# Patient Record
Sex: Female | Born: 1970 | Race: White | Hispanic: No | Marital: Married | Smoking: Current every day smoker
Health system: Southern US, Community
[De-identification: ages and names within clinical notes are randomized; demographics above are authoritative.]

## PROBLEM LIST (undated history)

## (undated) DIAGNOSIS — F419 Anxiety disorder, unspecified: Secondary | ICD-10-CM

## (undated) DIAGNOSIS — M1990 Unspecified osteoarthritis, unspecified site: Secondary | ICD-10-CM

## (undated) DIAGNOSIS — E785 Hyperlipidemia, unspecified: Secondary | ICD-10-CM

## (undated) DIAGNOSIS — T7840XA Allergy, unspecified, initial encounter: Secondary | ICD-10-CM

## (undated) HISTORY — DX: Allergy, unspecified, initial encounter: T78.40XA

## (undated) HISTORY — DX: Hyperlipidemia, unspecified: E78.5

## (undated) HISTORY — DX: Anxiety disorder, unspecified: F41.9

## (undated) HISTORY — DX: Unspecified osteoarthritis, unspecified site: M19.90

---

## 1998-09-27 ENCOUNTER — Other Ambulatory Visit: Admission: RE | Admit: 1998-09-27 | Discharge: 1998-09-27 | Payer: Self-pay | Admitting: Obstetrics & Gynecology

## 1999-10-15 ENCOUNTER — Other Ambulatory Visit: Admission: RE | Admit: 1999-10-15 | Discharge: 1999-10-15 | Payer: Self-pay | Admitting: Obstetrics & Gynecology

## 2000-10-11 ENCOUNTER — Other Ambulatory Visit: Admission: RE | Admit: 2000-10-11 | Discharge: 2000-10-11 | Payer: Self-pay | Admitting: Obstetrics & Gynecology

## 2001-07-12 ENCOUNTER — Inpatient Hospital Stay (HOSPITAL_COMMUNITY): Admission: AD | Admit: 2001-07-12 | Discharge: 2001-07-15 | Payer: Self-pay | Admitting: Obstetrics and Gynecology

## 2003-11-05 ENCOUNTER — Other Ambulatory Visit: Admission: RE | Admit: 2003-11-05 | Discharge: 2003-11-05 | Payer: Self-pay | Admitting: Obstetrics & Gynecology

## 2004-11-17 ENCOUNTER — Other Ambulatory Visit: Admission: RE | Admit: 2004-11-17 | Discharge: 2004-11-17 | Payer: Self-pay | Admitting: Obstetrics & Gynecology

## 2008-12-03 ENCOUNTER — Encounter: Admission: RE | Admit: 2008-12-03 | Discharge: 2008-12-03 | Payer: Self-pay | Admitting: Specialist

## 2009-08-23 ENCOUNTER — Encounter: Admission: RE | Admit: 2009-08-23 | Discharge: 2009-08-23 | Payer: Self-pay | Admitting: Family Medicine

## 2011-01-30 NOTE — Op Note (Signed)
[facility]  Patient:    Julia Garcia, Julia Garcia Visit Number: [id number] MRN: [id number]          Service Type: OBS Location: 910A 9107 01 Attending Physician:  Lars Pinks Dictated by:   Caralyn Guile Arlyce Dice, M.D. Proc. Date: 07/12/01 Admit Date:  07/12/2001                             Operative Report  PREOPERATIVE DIAGNOSES:       Breech at term.  POSTOPERATIVE DIAGNOSES:      Breech at term.  PROCEDURE:                    Primary low transverse cesarean section.  ANESTHESIA:                   Spinal.  SURGEON:                      Richard D. Arlyce Dice, M.D.  ASSISTANT:                    Luvenia Redden, M.D.  ESTIMATED BLOOD LOSS:         800 cc.  FINDINGS:                     Female infant.  Apgar scores 8 and 9.  Birth weight 7 pounds 0 ounces.  Clear amniotic fluid.  Normal uterus.  INDICATIONS:                  This is a 40 year old prima gravida with an estimated date of confinement of July 19, 2001.  Was found to be breech at 37 weeks.  The options of external version or breech delivery were discussed with the patient and she elected to proceed with primary cesarean section if the baby did not spontaneously evert.  At 39 weeks the patient was noted to still be in the breech presentation and the decision was made to proceed with cesarean section.  PROCEDURE:                    The patient was taken to the operating room and a spinal anesthesia was placed.  The abdomen was prepped and draped in a sterile fashion.  The bladder was catheterized.  A low transverse incision was made and carried down to the fascia which was extended transversely.  The rectus sheath was then divided from the underlying rectus muscle.  The rectus muscle was divided in the midline and the peritoneum was entered sharply and extended by sharp and blunt dissection.  The lower segment was identified. The serosa was incised and the bladder was displaced  inferiorly.  The lower segment was entered sharply and extended transversely with bandage scissors. The infant was delivered in a breech presentation without difficulty.  Cord bloods were obtained.  The placenta was then delivered with traction on the cord.  The uterus was bluntly curettaged so that all membranes were removed. The lower segment was then closed with a running interlocking Vicryl 1 suture. The bladder plane was not reopposed.  The peritoneum and rectus muscle was closed in the midline.  The fascia was closed with a running 0 Vicryl suture. Subcutaneous tissue which was greater than 4 cm was closed with plain cat gut suture and the skin was closed with staples.  The patient tolerated  the procedure well and left the operating room in good condition. Dictated by:   Caralyn Guile Arlyce Dice, M.D. Attending Physician:  Lars Pinks DD:  07/12/01 TD:  07/12/01 Job: [id number]

## 2011-01-30 NOTE — Discharge Summary (Signed)
[facility]  Patient:    Julia Garcia, Julia Garcia Visit Number: [id number] MRN: [id number]          Service Type: OBS Location: 910A 9107 01 Attending Physician:  Lars Pinks Dictated by:   Miguel Aschoff, M.D. Admit Date:  07/12/2001 Discharge Date: 07/15/2001                             Discharge Summary  ADMISSION DIAGNOSES:          Intrauterine pregnancy at term, with a                               breech presentation.  OPERATIONS AND PROCEDURES:    Primary low-flap transverse cesarean section.  ANESTHESIA:                   Spinal.  BRIEF HISTORY:                The patient is a 40 year old white female, gravida 1, para 0, with estimated date of confinement of July 19, 2001. The patient had an essentially uncomplicated pregnancy; however, she did have a malpresentation with a breech presentation noted.  In view of this malpresentation, it was elected to proceed with primary cesarean section.  HOSPITAL COURSE:              This procedure was carried out on July 12, 2001 without difficulty under spinal anesthesia.  It resulted in the delivery of a viable female infant; Apgar 8 at one minute and 9 at five minutes, with the baby weighing 7 pounds 0 ounces.  The patients postoperative course was essentially uncomplicated.  Her hemoglobin remained stable, with hemoglobin being 10.4, hematocrit 30, white count 11,700.  She tolerated increase in ambulation and diet well.  By the July 15, 2001 was in satisfactory condition and felt to be able to stable enough to be discharged home.  HOME MEDICATIONS:             1. Tylox one q.3h. p.r.n. pain.                               2. Continue vitamins and iron.  FOLLOW-UP:                    The patient will be seen back in four weeks for a follow up examination.  INSTRUCTIONS:                 She knows to call if there are any problems such as pain or heavy bleeding, or any problems with her  incision.  She was sent home on a regular diet in satisfactory condition.   HOSPITAL COURSE:              A surgical consult was obtained with Marnee Spring. Arkin, M.D. and a CT scan was obtained to rule out appendicitis.  The CT showed no evidence for acute appendicitis.  The impression of the CT was no ancillary findings suggestive of appendicitis.  There is questionable thickening of the transverse loop of the duodenum, a small fibroid was noted. No focal abnormalities were noted.  The pregnancy appeared to be otherwise normal.  The patient was continued on analgesic therapy and hydration, and by October 29 was felt to be in  satisfactory condition with pain resolving.  Her hemoglobin at this time was 8.7 and white count was 8200.  DISPOSITION:                  The patient was sent home.  She was instructed to return to the office on July 18, 2001 for a follow-up examination.  She was to call for any problems such as fever, pain or heavy bleeding.  She was to continue on her prenatal vitamins and iron.  She was requested to use Tylenol for pain. Dictated by:   Miguel Aschoff, M.D. Attending Physician:  Lars Pinks DD:  07/31/01 TD:  08/01/01 Job: [id number]

## 2011-12-01 ENCOUNTER — Ambulatory Visit (INDEPENDENT_AMBULATORY_CARE_PROVIDER_SITE_OTHER): Payer: BC Managed Care – PPO | Admitting: Family Medicine

## 2011-12-01 VITALS — BP 121/82 | HR 86 | Temp 98.5°F | Resp 16 | Ht 61.75 in | Wt 206.8 lb

## 2011-12-01 DIAGNOSIS — R300 Dysuria: Secondary | ICD-10-CM

## 2011-12-01 DIAGNOSIS — N390 Urinary tract infection, site not specified: Secondary | ICD-10-CM

## 2011-12-01 LAB — POCT URINALYSIS DIPSTICK
Bilirubin, UA: NEGATIVE
Ketones, UA: NEGATIVE
Protein, UA: NEGATIVE
Spec Grav, UA: <=1.005
pH, UA: 7

## 2011-12-01 LAB — POCT UA - MICROSCOPIC ONLY: Crystals, Ur, HPF, POC: NEGATIVE

## 2011-12-01 MED ORDER — NITROFURANTOIN MONOHYD MACRO 100 MG PO CAPS: 100.0000 mg | ORAL_CAPSULE | Freq: Two times a day (BID) | ORAL | Status: AC

## 2011-12-01 NOTE — Progress Notes (Signed)
[facility]:  Office Visit  Chief Complaint:  Chief Complaint  Patient presents with  . Urinary Frequency    burning urination x last night    HPI: Julia Garcia is a 41 y.o. female who complains of  Dysuria x 1 day. No other sxs. Treid Bactrim and Macrobid inpast with good relief.Denies fevers, chills, pelvic or abd pain, N/V.  Did not try anything OTC.   Past Medical History  Diagnosis Date  . Migraine    Past Surgical History  Procedure Date  . Cesarean section    History   Social History  . Marital Status: Married    Spouse Name: N/A    Number of Children: N/A  . Years of Education: N/A   Social History Main Topics  . Smoking status: Current Everyday Smoker -- 0.8 packs/day for 20 years    Types: Cigarettes  . Smokeless tobacco: Not on file  . Alcohol Use: No  . Drug Use: No  . Sexually Active: Not on file   Other Topics Concern  . Not on file   Social History Narrative  . No narrative on file   Family History  Problem Relation Age of Onset  . Cancer Mother    Allergies  Allergen Reactions  . Latex Swelling  . Zithromax (Azithromycin) Swelling    Gets blisters filled with fluid   Prior to Admission medications   Not on File     ROS: The patient denies fevers, chills, night sweats, unintentional weight loss, chest pain, palpitations, wheezing, dyspnea on exertion, nausea, vomiting, abdominal pain,  hematuria, melena, numbness, weakness, or tingling. + dysuria  All other systems have been reviewed and were otherwise negative with the exception of those mentioned in the HPI and as above.    PHYSICAL EXAM: Filed Vitals:   12/01/11 0940  BP: 121/82  Pulse: 86  Temp: 98.5 F (36.9 C)  Resp: 16   Filed Vitals:   12/01/11 0940  Height: 5' 1.75" (1.568 m)  Weight: 206 lb 12.8 oz (93.804 kg)   Body mass index is 38.13 kg/(m^2).  General: Alert, no acute distress HEENT:  Normocephalic, atraumatic, oropharynx patent.    Cardiovascular:  Regular rate and rhythm, no rubs murmurs or gallops.  No Carotid bruits, radial pulse intact. No pedal edema.  Respiratory: Clear to auscultation bilaterally.  No wheezes, rales, or rhonchi.  No cyanosis, no use of accessory musculature GI: No organomegaly, abdomen is soft and non-tender, positive bowel sounds.  No masses. Skin: No rashes. Neurologic: Facial musculature symmetric. Psychiatric: Patient is appropriate throughout our interaction. Lymphatic: No cervical lymphadenopathy Musculoskeletal: Gait intact. NO CVA tenderness   LABS: Results for orders placed in visit on 12/01/11  POCT UA - MICROSCOPIC ONLY      Component Value Range   WBC, Ur, HPF, POC 3-6     RBC, urine, microscopic 2-4     Bacteria, U Microscopic trace     Mucus, UA negative     Epithelial cells, urine per micros 2-4     Crystals, Ur, HPF, POC negative     Casts, Ur, LPF, POC negative     Yeast, UA negative    POCT URINALYSIS DIPSTICK      Component Value Range   Color, UA yellow     Clarity, UA clear     Glucose, UA negative     Bilirubin, UA negative     Ketones, UA negative     Spec Grav, UA <=  1.005     Blood, UA trace     pH, UA 7.0     Protein, UA negative     Urobilinogen, UA 0.2     Nitrite, UA negative     Leukocytes, UA Trace       EKG/XRAY:   Primary read interpreted by Dr. Conley Rolls at [facility].   ASSESSMENT/PLAN: Encounter Diagnosis  Name Primary?  . Dysuria Yes   UTI Will get culture Rx Macrobid    Augustine Leverette PHUONG, DO 12/01/2011 10:23 AM

## 2011-12-03 LAB — URINE CULTURE

## 2012-04-30 ENCOUNTER — Ambulatory Visit (INDEPENDENT_AMBULATORY_CARE_PROVIDER_SITE_OTHER): Payer: BC Managed Care – PPO | Admitting: Internal Medicine

## 2012-04-30 VITALS — BP 123/79 | HR 91 | Temp 98.6°F | Resp 17 | Ht 62.0 in | Wt 224.0 lb

## 2012-04-30 DIAGNOSIS — T300 Burn of unspecified body region, unspecified degree: Secondary | ICD-10-CM

## 2012-04-30 DIAGNOSIS — N390 Urinary tract infection, site not specified: Secondary | ICD-10-CM

## 2012-04-30 LAB — POCT URINALYSIS DIPSTICK
Bilirubin, UA: NEGATIVE
Glucose, UA: NEGATIVE
Nitrite, UA: NEGATIVE
Urobilinogen, UA: 0.2

## 2012-04-30 LAB — POCT UA - MICROSCOPIC ONLY
Casts, Ur, LPF, POC: NEGATIVE
Mucus, UA: NEGATIVE
Yeast, UA: NEGATIVE

## 2012-04-30 MED ORDER — PHENAZOPYRIDINE HCL 200 MG PO TABS: 200.0000 mg | ORAL_TABLET | Freq: Three times a day (TID) | ORAL | Status: AC | PRN

## 2012-04-30 MED ORDER — CIPROFLOXACIN HCL 500 MG PO TABS: 500.0000 mg | ORAL_TABLET | Freq: Two times a day (BID) | ORAL | Status: AC

## 2012-04-30 NOTE — Patient Instructions (Addendum)

## 2012-04-30 NOTE — Progress Notes (Signed)
  Subjective:    Patient ID: Julia Garcia, female    DOB: [birth year], 41 y.o.   MRN: [id number]  HPI Has 3-4d of vague sxs, then sudden onset of burning, urgency, freq. Has had 25-30 utis over past 10 years, never seen a urologist.   Review of Systems     Objective:   Physical Exam  Nursing note and vitals reviewed. Constitutional: She is oriented to person, place, and time. No distress.  Pulmonary/Chest: Effort normal.  Abdominal: Soft. There is tenderness. There is no rebound and no guarding.  Neurological: She is alert and oriented to person, place, and time.  Skin: Skin is warm and dry.  Psychiatric: She has a normal mood and affect.   Tender over bladder, no flank or kidney tenderness. Results for orders placed in visit on 04/30/12  POCT UA - MICROSCOPIC ONLY      Component Value Range   WBC, Ur, HPF, POC 1-3     RBC, urine, microscopic neg     Bacteria, U Microscopic 1     Mucus, UA neg     Epithelial cells, urine per micros neg     Crystals, Ur, HPF, POC neg     Casts, Ur, LPF, POC neg     Yeast, UA neg    POCT URINALYSIS DIPSTICK      Component Value Range   Color, UA yellow     Clarity, UA clear     Glucose, UA neg     Bilirubin, UA neg     Ketones, UA neg     Spec Grav, UA 1.010     Blood, UA mod     pH, UA 7.0     Protein, UA neg     Urobilinogen, UA 0.2     Nitrite, UA neg     Leukocytes, UA Trace     c and s done       Assessment & Plan:  Cipro/pyridium

## 2012-05-03 LAB — URINE CULTURE: Colony Count: >=100000

## 2013-08-29 ENCOUNTER — Ambulatory Visit (INDEPENDENT_AMBULATORY_CARE_PROVIDER_SITE_OTHER): Payer: BC Managed Care – PPO | Admitting: Family Medicine

## 2013-08-29 VITALS — BP 118/68 | HR 97 | Temp 98.4°F | Resp 18 | Ht 61.5 in | Wt 199.0 lb

## 2013-08-29 DIAGNOSIS — R52 Pain, unspecified: Secondary | ICD-10-CM

## 2013-08-29 DIAGNOSIS — R059 Cough, unspecified: Secondary | ICD-10-CM

## 2013-08-29 DIAGNOSIS — R05 Cough: Secondary | ICD-10-CM

## 2013-08-29 DIAGNOSIS — J069 Acute upper respiratory infection, unspecified: Secondary | ICD-10-CM

## 2013-08-29 DIAGNOSIS — J029 Acute pharyngitis, unspecified: Secondary | ICD-10-CM

## 2013-08-29 MED ORDER — HYDROCOD POLST-CHLORPHEN POLST 10-8 MG/5ML PO LQCR: 5.0000 mL | Freq: Every evening | ORAL | Status: DC | PRN

## 2013-08-29 MED ORDER — AMOXICILLIN-POT CLAVULANATE 875-125 MG PO TABS: 1.0000 | ORAL_TABLET | Freq: Two times a day (BID) | ORAL | Status: DC

## 2013-08-29 MED ORDER — IPRATROPIUM BROMIDE 0.03 % NA SOLN: 2.0000 | Freq: Two times a day (BID) | NASAL | Status: DC

## 2013-08-29 NOTE — Patient Instructions (Signed)

## 2013-08-29 NOTE — Progress Notes (Addendum)
Subjective:  This chart was scribed for Nilda Simmer, MD by Carl Best, Medical Scribe. This patient was seen in Room 5 and the patient's care was started at 9:37 AM.   Patient ID: Julia Garcia, female    DOB: 09-Jan-1971, 42 y.o.   MRN: [id number]  HPI HPI Comments: Julia Garcia is a 42 y.o. female who presents to the [facility] complaining of constant, deep cough productive of green and brown sputum, sore throat, sinusitis, generalized body aches, diarrhea, and nausea that started three days ago.  She states that her symptoms have traveled throughout her body and she states that they are now located in her chest.  The patient states that she had an intermittent fever that started on Saturday and ran from 99 - 100 degrees.  She states that she has not experienced a fever today.  She states that she has chills and diaphoresis at baseline.  The patient lists headache, sore throat, and rhinorrhea as associated symptoms.  She states that her mucous is thick and white.  She states that she noticed white patches in the back of her throat and has had trouble swallowing.  She states that she has had 4-5 episodes of diarrhea yesterday and 3 episodes today.  She denies SOB as an associated symptom.  She states that she has used Mucinex D for her symptoms with no relief.    The patient denies having a history of chronic health problems.   The patient states that she is a smoker.  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  She states that she did not receive a flu shot this year.     Past Medical History  Diagnosis Date  . Migraine   . Anxiety    Past Surgical History  Procedure Laterality Date  . Cesarean section     Family History  Problem Relation Age of Onset  . Cancer Mother    History   Social History  . Marital Status: Married    Spouse Name: N/A    Number of Children: N/A  . Years of  Education: N/A   Occupational History  . Not on file.   Social History Main Topics  . Smoking status: Current Every Day Smoker -- 0.80 packs/day for 20 years    Types: Cigarettes  . Smokeless tobacco: Not on file  . Alcohol Use: No  . Drug Use: No  . Sexual Activity: Not on file   Other Topics Concern  . Not on file   Social History Narrative  . No narrative on file   Allergies  Allergen Reactions  . Latex Swelling  . Zithromax [Azithromycin] Swelling    Gets blisters filled with fluid     Review of Systems  Constitutional: Positive for fever (99-100 degrees).  HENT: Positive for postnasal drip, rhinorrhea and trouble swallowing.   Respiratory: Positive for cough. Negative for shortness of breath.   Gastrointestinal: Positive for nausea and diarrhea. Negative for vomiting.  Neurological: Positive for headaches.  All other systems reviewed and are negative.     Objective:  Physical Exam  Nursing note and vitals reviewed. Constitutional: She is oriented to person, place, and time. She appears well-developed and well-nourished. No distress.  HENT:  Head: Normocephalic and atraumatic.  Right Ear: External ear normal.  Left Ear: External ear normal.  Mouth/Throat: Posterior oropharyngeal erythema (diffusely) present.     Eyes:  Conjunctivae and EOM are normal. Pupils are equal, round, and reactive to light.  Neck: Neck supple.  Cardiovascular: Normal rate, regular rhythm and normal heart sounds.  Exam reveals no gallop and no friction rub.   No murmur heard. Pulmonary/Chest: Effort normal and breath sounds normal. No respiratory distress. She has no wheezes. She has no rales.  Lymphadenopathy:    She has cervical adenopathy.  Neurological: She is alert and oriented to person, place, and time. No cranial nerve deficit.  Psychiatric: She has a normal mood and affect. Her behavior is normal.    BP 118/68  Pulse 97  Temp(Src) 98.4 F (36.9 C) (Oral)  Resp 18  Ht  5' 1.5" (1.562 m)  Wt 199 lb (90.266 kg)  BMI 37.00 kg/m2  SpO2 97%  LMP 08/20/2013  Results for orders placed in visit on 08/29/13  POCT INFLUENZA A/B      Result Value Range   Influenza A, POC Negative     Influenza B, POC Negative       Assessment & Plan:  Cough - Plan: POCT Influenza A/B  Body aches - Plan: POCT Influenza A/B  Acute pharyngitis  1. URI:  New. Versus influenza infection. Consistent with viral syndrome. Recommend rest, fluids, Ibuprofen.  Continue Mucinex D bid.  Rx for Atrovent nasal spray, Tussionex provided. If no improvement in one week, start Augmentin for secondary sinusitis.  Meds ordered this encounter  Medications  . pseudoephedrine-guaifenesin (MUCINEX D) 60-600 MG per tablet    Sig: Take 1 tablet by mouth every 12 (twelve) hours.  Marland Kitchen ipratropium (ATROVENT) 0.03 % nasal spray    Sig: Place 2 sprays into the nose 2 (two) times daily.    Dispense:  30 mL    Refill:  5  . chlorpheniramine-HYDROcodone (TUSSIONEX) 10-8 MG/5ML LQCR    Sig: Take 5 mLs by mouth at bedtime as needed for cough.    Dispense:  180 mL    Refill:  0  . amoxicillin-clavulanate (AUGMENTIN) 875-125 MG per tablet    Sig: Take 1 tablet by mouth 2 (two) times daily.    Dispense:  20 tablet    Refill:  0    I personally performed the services described in this documentation, which was scribed in my presence. The recorded information has been reviewed and is accurate.  Nilda Simmer, M.D.  [facility] [address] [place], [place]  [address] [phone] phone [phone] fax

## 2014-11-22 ENCOUNTER — Ambulatory Visit (INDEPENDENT_AMBULATORY_CARE_PROVIDER_SITE_OTHER): Payer: BLUE CROSS/BLUE SHIELD | Admitting: Physician Assistant

## 2014-11-22 VITALS — BP 124/70 | HR 98 | Temp 98.2°F | Resp 21 | Ht 61.0 in | Wt 214.8 lb

## 2014-11-22 DIAGNOSIS — J0100 Acute maxillary sinusitis, unspecified: Secondary | ICD-10-CM

## 2014-11-22 MED ORDER — BENZONATATE 100 MG PO CAPS: 100.0000 mg | ORAL_CAPSULE | Freq: Three times a day (TID) | ORAL | Status: DC | PRN

## 2014-11-22 MED ORDER — IPRATROPIUM BROMIDE 0.03 % NA SOLN: 2.0000 | Freq: Two times a day (BID) | NASAL | Status: DC

## 2014-11-22 MED ORDER — AMOXICILLIN-POT CLAVULANATE 875-125 MG PO TABS: 1.0000 | ORAL_TABLET | Freq: Two times a day (BID) | ORAL | Status: DC

## 2014-11-22 NOTE — Patient Instructions (Signed)

## 2014-11-22 NOTE — Progress Notes (Signed)
Subjective:    Patient ID: Julia Garcia, female    DOB: 1971/01/31, 44 y.o.   MRN: [id number]  HPI Patient presents for 2 weeks of sinus pressure and congestion that got worsened last night. She has been unable to sleep and sx are accompanied with bilateral ear pain, chills, feeling hot, myalgias, productive cough, rhinorrhea, and HA. Has tried Dimeatapp, Zyrtec, and Mucinex without relief. Has multiple sick contacts at work. Has h/o seasonal allergies and smoking, but not asthma. Med allergy to Azithromycin.   Review of Systems  Constitutional: Positive for chills, appetite change and fatigue. Negative for fever and diaphoresis.  HENT: Positive for congestion, ear pain, rhinorrhea, sinus pressure and sore throat. Negative for ear discharge, postnasal drip and sneezing.   Respiratory: Positive for cough. Negative for shortness of breath.   Cardiovascular: Negative for chest pain.  Gastrointestinal: Positive for nausea. Negative for vomiting.  Musculoskeletal: Negative for neck pain and neck stiffness.  Allergic/Immunologic: Positive for environmental allergies.  Neurological: Positive for headaches. Negative for dizziness and light-headedness.       Objective:   Physical Exam  Constitutional: She is oriented to person, place, and time. She appears well-developed and well-nourished. No distress.  Blood pressure 124/70, pulse 98, temperature 98.2 F (36.8 C), resp. rate 21, height 5\' 1"  (1.549 m), weight 214 lb 12.8 oz (97.433 kg), last menstrual period 11/12/2014, SpO2 93 %.  HENT:  Head: Normocephalic and atraumatic.  Right Ear: External ear normal. No swelling or tenderness. Tympanic membrane is not injected, not scarred, not perforated, not erythematous, not retracted and not bulging. No middle ear effusion.  Left Ear: External ear normal. No swelling or tenderness. Tympanic membrane is not injected, not scarred, not perforated, not erythematous, not retracted and not bulging. A  middle ear effusion (serous) is present.  Nose: Rhinorrhea (nares moderately erythematous) present. No mucosal edema. Right sinus exhibits maxillary sinus tenderness. Right sinus exhibits no frontal sinus tenderness. Left sinus exhibits maxillary sinus tenderness. Left sinus exhibits no frontal sinus tenderness.  Mouth/Throat: Uvula is midline. Posterior oropharyngeal erythema present. No oropharyngeal exudate, posterior oropharyngeal edema or tonsillar abscesses.  Eyes: Conjunctivae are normal. Pupils are equal, round, and reactive to light. Right eye exhibits discharge (watery). Left eye exhibits discharge (watery). No scleral icterus.  Neck: Normal range of motion. Neck supple. No thyromegaly present.  Cardiovascular: Normal rate and regular rhythm.  Exam reveals no gallop and no friction rub.   No murmur heard. Pulmonary/Chest: Effort normal and breath sounds normal. No respiratory distress. She has no decreased breath sounds. She has no wheezes. She has no rhonchi. She has no rales. She exhibits no tenderness.  Abdominal: Soft. Bowel sounds are normal. There is no tenderness.  Lymphadenopathy:    She has cervical adenopathy.  Neurological: She is alert and oriented to person, place, and time.  Skin: Skin is warm and dry. No rash noted. She is not diaphoretic. No erythema. No pallor.       Assessment & Plan:  1. Acute maxillary sinusitis, recurrence not specified Can use ibuprofen for pain and fevers. - amoxicillin-clavulanate (AUGMENTIN) 875-125 MG per tablet; Take 1 tablet by mouth 2 (two) times daily.  Dispense: 20 tablet; Refill: 0 - ipratropium (ATROVENT) 0.03 % nasal spray; Place 2 sprays into the nose 2 (two) times daily.  Dispense: 30 mL; Refill: 5 - benzonatate (TESSALON) 100 MG capsule; Take 1-2 capsules (100-200 mg total) by mouth 3 (three) times daily as needed for cough.  Dispense: 40  capsule; Refill: 0   Harlan Ervine PA-C  [facility] 11/22/2014 2:40 PM

## 2015-07-29 ENCOUNTER — Ambulatory Visit (INDEPENDENT_AMBULATORY_CARE_PROVIDER_SITE_OTHER): Payer: BLUE CROSS/BLUE SHIELD | Admitting: Family Medicine

## 2015-07-29 VITALS — BP 110/70 | HR 80 | Temp 98.3°F | Resp 18 | Ht 61.0 in | Wt 206.4 lb

## 2015-07-29 DIAGNOSIS — S161XXA Strain of muscle, fascia and tendon at neck level, initial encounter: Secondary | ICD-10-CM | POA: Diagnosis not present

## 2015-07-29 DIAGNOSIS — M545 Low back pain, unspecified: Secondary | ICD-10-CM

## 2015-07-29 DIAGNOSIS — M542 Cervicalgia: Secondary | ICD-10-CM | POA: Diagnosis not present

## 2015-07-29 MED ORDER — HYDROCODONE-ACETAMINOPHEN 5-325 MG PO TABS: 1.0000 | ORAL_TABLET | Freq: Four times a day (QID) | ORAL | Status: DC | PRN

## 2015-07-29 MED ORDER — DICLOFENAC SODIUM 75 MG PO TBEC: 75.0000 mg | DELAYED_RELEASE_TABLET | Freq: Two times a day (BID) | ORAL | Status: DC

## 2015-07-29 MED ORDER — METHOCARBAMOL 500 MG PO TABS: ORAL_TABLET | ORAL | Status: DC

## 2015-07-29 NOTE — Progress Notes (Signed)
Patient ID: Julia Garcia, female    DOB: [birth year]  Age: 44 y.o. MRN: [id number]  Chief Complaint  Patient presents with  . Back Pain    low back   . Neck Pain    Subjective:   44 year old lady who is here with cervical and low back pain. She felt a pop in her neck in the night associated with pain. She is persistently having severe pain on the left side of the neck. She had been doing some work at home this weekend, but that is nothing new for her. However she has had low back pain also today, which she thinks may be associated with the neck pain. She uses the toilet and went down to her knees because she could not bend down and pull her pants up, so she left work and came over here. She has not had a major cervical trauma. She does have a history of some intermittent low back pains, but this neck stuff his new.  Current allergies, medications, problem list, past/family and social histories reviewed.  Objective:  BP 110/70 mmHg  Pulse 80  Temp(Src) 98.3 F (36.8 C) (Oral)  Resp 18  Ht 5\' 1"  (1.549 m)  Wt 206 lb 6.4 oz (93.622 kg)  BMI 39.02 kg/m2  SpO2 97%  LMP 07/22/2015  Healthy-appearing lady in no major distress until she tries to move and she moves slowly. Her neck is held rigid. She is tender in the left paraspinous muscles of the neck. This cervical spine itself seems normal. Range of motion of the neck has tremendous pain when tilted to the left, or flexed or extended or rotated. Upper motor strength is good and range of motion.. Low back is a little tender in the low back paraspinous muscles. Spine intact. Deep reflexes symmetrical.    Assessment & Plan:   Assessment: 1. Cervical strain, acute, initial encounter   2. Cervical pain   3. Bilateral low back pain without sciatica       Plan: See instructions. She is to return if not improving. She will stay off work the rest of today, and possibly tomorrow if needed.  No orders of the defined types were placed in  this encounter.    Meds ordered this encounter  Medications  . diclofenac (VOLTAREN) 75 MG EC tablet    Sig: Take 1 tablet (75 mg total) by mouth 2 (two) times daily.    Dispense:  30 tablet    Refill:  0  . methocarbamol (ROBAXIN) 500 MG tablet    Sig: Take 1 pill 3 times daily at meals and 2 at bedtime for muscle relaxant as needed    Dispense:  40 tablet    Refill:  0  . HYDROcodone-acetaminophen (NORCO) 5-325 MG tablet    Sig: Take 1 tablet by mouth every 6 (six) hours as needed.    Dispense:  20 tablet    Refill:  0         Patient Instructions  Take Robaxin (methocarbamol) 500 mg 1 tablet 3 times daily at meals and 2 tablets at bedtime as needed for muscle relaxant  Take diclofenac one twice daily for pain and inflammation(this is in the class of ibuprofen and Aleve and you should not take either of them)  You can take additional Tylenol maximum 1000 mg 3 times daily for pain additionally if needed  Norco (hydrocodone/acetaminophen) 5/325 one pill every 4-6 hours as needed for severe pain only. These each containing 325 mg of  Tylenol which is acetaminophen, so that should be entered into the daily total of no more than 3000 mg of acetaminophen daily  Return if not improving  Apply ice or heat or alternate as needed  Gentle limbering up exercises.     Return if symptoms worsen or fail to improve.   Nolin Grell, MD 07/29/2015

## 2015-07-29 NOTE — Patient Instructions (Signed)
Take Robaxin (methocarbamol) 500 mg 1 tablet 3 times daily at meals and 2 tablets at bedtime as needed for muscle relaxant  Take diclofenac one twice daily for pain and inflammation(this is in the class of ibuprofen and Aleve and you should not take either of them)  You can take additional Tylenol maximum 1000 mg 3 times daily for pain additionally if needed  Norco (hydrocodone/acetaminophen) 5/325 one pill every 4-6 hours as needed for severe pain only. These each containing 325 mg of Tylenol which is acetaminophen, so that should be entered into the daily total of no more than 3000 mg of acetaminophen daily  Return if not improving  Apply ice or heat or alternate as needed  Gentle limbering up exercises.

## 2016-02-24 ENCOUNTER — Other Ambulatory Visit: Payer: Self-pay | Admitting: Obstetrics & Gynecology

## 2016-02-24 DIAGNOSIS — R928 Other abnormal and inconclusive findings on diagnostic imaging of breast: Secondary | ICD-10-CM

## 2016-02-27 ENCOUNTER — Ambulatory Visit
Admission: RE | Admit: 2016-02-27 | Discharge: 2016-02-27 | Disposition: A | Discharge status: Home / Self Care | Payer: BLUE CROSS/BLUE SHIELD | Source: Ambulatory Visit | Attending: Obstetrics & Gynecology | Admitting: Obstetrics & Gynecology

## 2016-02-27 DIAGNOSIS — R928 Other abnormal and inconclusive findings on diagnostic imaging of breast: Secondary | ICD-10-CM

## 2018-01-26 DIAGNOSIS — T148XXA Other injury of unspecified body region, initial encounter: Secondary | ICD-10-CM | POA: Insufficient documentation

## 2018-02-04 ENCOUNTER — Encounter: Payer: Self-pay | Admitting: Physician Assistant

## 2018-02-04 ENCOUNTER — Other Ambulatory Visit: Payer: Self-pay

## 2018-02-04 ENCOUNTER — Ambulatory Visit: Payer: 59 | Admitting: Physician Assistant

## 2018-02-04 VITALS — BP 110/74 | HR 89 | Temp 98.8°F | Ht 61.5 in | Wt 214.6 lb

## 2018-02-04 DIAGNOSIS — S29011A Strain of muscle and tendon of front wall of thorax, initial encounter: Secondary | ICD-10-CM

## 2018-02-04 DIAGNOSIS — R079 Chest pain, unspecified: Secondary | ICD-10-CM

## 2018-02-04 MED ORDER — CYCLOBENZAPRINE HCL 10 MG PO TABS: 10.0000 mg | ORAL_TABLET | Freq: Three times a day (TID) | ORAL | 0 refills | Status: DC | PRN

## 2018-02-04 MED ORDER — TRAMADOL HCL 50 MG PO TABS: 50.0000 mg | ORAL_TABLET | Freq: Three times a day (TID) | ORAL | 0 refills | Status: AC | PRN

## 2018-02-04 NOTE — Patient Instructions (Addendum)
Flexeril is a muscle relaxer. Take this as directed. This may make you drowsy.  If flexeril is not helping relieve your apin you may take tramadol. Tramadol is an opioid pain medication. Please take this only as directed. This may make you drowsy. Do not drive or operate heavy machinery while taking this.  SO NOT TAKE THESE MEDICATIONS AT THE SAME TIME.  You may take ibuprofen/tylenol while taking these.   This injury may take 3-4 weeks to heal.  Come back if you are not improving or if your condition worsens.   ?Rest is achieved by limiting weightbearing/lifting for the next 2-3 weeks.Marland Kitchen ?Apply ice for 15 to 20 minutes every two to three hours for the first 48 hours or until swelling is improved, whichever comes first.you may also apply heat to the area - whichever feels better.   Nonsteroidal antiinflammatory drugs (NSAIDs) can be used for pain relief. So naproxen, meloxicam, ibuprofen, motrin or aleve. You can also take Tylenol while taking an NSAID.   Exercise is the mainstay of recovery. Range of motion exercises, including plantar flexion, dorsiflexion, and foot circles, should be started early, once acute pain and swelling subside, to maintain range of motion. The intensity of rehabilitation is increased gradually. Ankle splints or braces can limit extremes of joint motion and allow early weightbearing while protecting against reinjury.  Ankle injuries may take up to 8 weeks to heal.   .     Tramadol tablets What is this medicine? TRAMADOL (TRA ma dole) is a pain reliever. It is used to treat moderate to severe pain in adults. This medicine may be used for other purposes; ask your health care provider or pharmacist if you have questions. COMMON BRAND NAME(S): Ultram What should I tell my health care provider before I take this medicine? They need to know if you have any of these conditions: -brain tumor -depression -drug abuse or addiction -head injury -if you frequently drink  alcohol containing drinks -kidney disease or trouble passing urine -liver disease -lung disease, asthma, or breathing problems -seizures or epilepsy -suicidal thoughts, plans, or attempt; a previous suicide attempt by you or a family member -an unusual or allergic reaction to tramadol, codeine, other medicines, foods, dyes, or preservatives -pregnant or trying to get pregnant -breast-feeding How should I use this medicine? Take this medicine by mouth with a full glass of water. Follow the directions on the prescription label. You can take it with or without food. If it upsets your stomach, take it with food. Do not take your medicine more often than directed. A special MedGuide will be given to you by the pharmacist with each prescription and refill. Be sure to read this information carefully each time. Talk to your pediatrician regarding the use of this medicine in children. Special care may be needed. Overdosage: If you think you have taken too much of this medicine contact a poison control center or emergency room at once. NOTE: This medicine is only for you. Do not share this medicine with others. What if I miss a dose? If you miss a dose, take it as soon as you can. If it is almost time for your next dose, take only that dose. Do not take double or extra doses. What may interact with this medicine? Do not take this medication with any of the following medicines: -MAOIs like Carbex, Eldepryl, Marplan, Nardil, and Parnate This medicine may also interact with the following medications: -alcohol -antihistamines for allergy, cough and cold -certain medicines  for anxiety or sleep -certain medicines for depression like amitriptyline, fluoxetine, sertraline -certain medicines for migraine headache like almotriptan, eletriptan, frovatriptan, naratriptan, rizatriptan, sumatriptan, zolmitriptan -certain medicines for seizures like carbamazepine, oxcarbazepine, phenobarbital, primidone -certain  medicines that treat or prevent blood clots like warfarin -digoxin -furazolidone -general anesthetics like halothane, isoflurane, methoxyflurane, propofol -linezolid -local anesthetics like lidocaine, pramoxine, tetracaine -medicines that relax muscles for surgery -other narcotic medicines for pain or cough -phenothiazines like chlorpromazine, mesoridazine, prochlorperazine, thioridazine -procarbazine This list may not describe all possible interactions. Give your health care provider a list of all the medicines, herbs, non-prescription drugs, or dietary supplements you use. Also tell them if you smoke, drink alcohol, or use illegal drugs. Some items may interact with your medicine. What should I watch for while using this medicine? Tell your doctor or health care professional if your pain does not go away, if it gets worse, or if you have new or a different type of pain. You may develop tolerance to the medicine. Tolerance means that you will need a higher dose of the medicine for pain relief. Tolerance is normal and is expected if you take this medicine for a long time. Do not suddenly stop taking your medicine because you may develop a severe reaction. Your body becomes used to the medicine. This does NOT mean you are addicted. Addiction is a behavior related to getting and using a drug for a non-medical reason. If you have pain, you have a medical reason to take pain medicine. Your doctor will tell you how much medicine to take. If your doctor wants you to stop the medicine, the dose will be slowly lowered over time to avoid any side effects. There are different types of narcotic medicines (opiates). If you take more than one type at the same time or if you are taking another medicine that also causes drowsiness, you may have more side effects. Give your health care provider a list of all medicines you use. Your doctor will tell you how much medicine to take. Do not take more medicine than  directed. Call emergency for help if you have problems breathing or unusual sleepiness. You may get drowsy or dizzy. Do not drive, use machinery, or do anything that needs mental alertness until you know how this medicine affects you. Do not stand or sit up quickly, especially if you are an older patient. This reduces the risk of dizzy or fainting spells. Alcohol can increase or decrease the effects of this medicine. Avoid alcoholic drinks. You may have constipation. Try to have a bowel movement at least every 2 to 3 days. If you do not have a bowel movement for 3 days, call your doctor or health care professional. Your mouth may get dry. Chewing sugarless gum or sucking hard candy, and drinking plenty of water may help. Contact your doctor if the problem does not go away or is severe. What side effects may I notice from receiving this medicine? Side effects that you should report to your doctor or health care professional as soon as possible: -allergic reactions like skin rash, itching or hives, swelling of the face, lips, or tongue -breathing problems -confusion -seizures -signs and symptoms of low blood pressure like dizziness; feeling faint or lightheaded, falls; unusually weak or tired -trouble passing urine or change in the amount of urine Side effects that usually do not require medical attention (report to your doctor or health care professional if they continue or are bothersome): -constipation -dry mouth -nausea, vomiting -tiredness  This list may not describe all possible side effects. Call your doctor for medical advice about side effects. You may report side effects to FDA at 1-800-FDA-1088. Where should I keep my medicine? Keep out of the reach of children. This medicine may cause accidental overdose and death if it taken by other adults, children, or pets. Mix any unused medicine with a substance like cat litter or coffee grounds. Then throw the medicine away in a sealed container  like a sealed bag or a coffee can with a lid. Do not use the medicine after the expiration date. Store at room temperature between 15 and 30 degrees C (59 and 86 degrees F). NOTE: This sheet is a summary. It may not cover all possible information. If you have questions about this medicine, talk to your doctor, pharmacist, or health care provider.  2018 Elsevier/Gold Standard (2015-05-26 09:00:04)  IF you received an x-ray today, you will receive an invoice from [facility]. Please contact [facility] at [phone] with questions or concerns regarding your invoice.   IF you received labwork today, you will receive an invoice from [facility]. Please contact [facility] at [phone] with questions or concerns regarding your invoice.   Our billing staff will not be able to assist you with questions regarding bills from these companies.  You will be contacted with the lab results as soon as they are available. The fastest way to get your results is to activate your My Chart account. Instructions are located on the last page of this paperwork. If you have not heard from [place] regarding the results in 2 weeks, please contact this office.

## 2018-02-04 NOTE — Progress Notes (Signed)
   Julia Garcia  MRN: [id number] DOB: 1970-12-16  PCP: Orma Flaming, MD  Subjective:  Pt is a 47 year old female who presents to clinic for pain under left breast x 8 days.  MOI: lifting TV. Pain is under the left breast and to the left of her sternum.  Pain is worse with coughing or sneezing. Hurts when she presses on the area.  She has taken Tylenol and Ibuprofen - not helping.  She is wearing a sports bra instead of underwire. Denies shob, wheezing, neck pain, numbness, tingling, skin changes.    Review of Systems  Constitutional: Negative for diaphoresis and fatigue.  Respiratory: Negative for cough, chest tightness, shortness of breath and wheezing.   Cardiovascular: Positive for chest pain. Negative for palpitations and leg swelling.    There are no active problems to display for this patient.   Current Outpatient Medications on File Prior to Visit  Medication Sig Dispense Refill  . diclofenac (VOLTAREN) 75 MG EC tablet Take 1 tablet (75 mg total) by mouth 2 (two) times daily. (Patient not taking: Reported on 02/04/2018) 30 tablet 0  . HYDROcodone-acetaminophen (NORCO) 5-325 MG tablet Take 1 tablet by mouth every 6 (six) hours as needed. (Patient not taking: Reported on 02/04/2018) 20 tablet 0  . ipratropium (ATROVENT) 0.03 % nasal spray Place 2 sprays into the nose 2 (two) times daily. (Patient not taking: Reported on 07/29/2015) 30 mL 5  . methocarbamol (ROBAXIN) 500 MG tablet Take 1 pill 3 times daily at meals and 2 at bedtime for muscle relaxant as needed (Patient not taking: Reported on 02/04/2018) 40 tablet 0   No current facility-administered medications on file prior to visit.     Allergies  Allergen Reactions  . Latex Swelling  . Zithromax [Azithromycin] Swelling    Gets blisters filled with fluid     Objective:  BP 110/74 (BP Location: Left Arm, Patient Position: Sitting, Cuff Size: Normal)   Pulse 89   Temp 98.8 F (37.1 C) (Oral)   Ht 5' 1.5" (1.562 m)    Wt 214 lb 9.6 oz (97.3 kg)   LMP 02/03/2018   SpO2 96%   BMI 39.89 kg/m   Physical Exam  Constitutional: She is oriented to person, place, and time. No distress.  Pulmonary/Chest: She exhibits tenderness (left of midline and under left breast). She exhibits no bony tenderness, no crepitus, no edema and no deformity.    Neurological: She is alert and oriented to person, place, and time.  Skin: Skin is warm and dry.  Psychiatric: Judgment normal.  Vitals reviewed.   Assessment and Plan :  1. Muscle strain of chest wall, initial encounter 2. Chest pain, unspecified type - cyclobenzaprine (FLEXERIL) 10 MG tablet; Take 1 tablet (10 mg total) by mouth 3 (three) times daily as needed for muscle spasms.  Dispense: 30 tablet; Refill: 0 - traMADol (ULTRAM) 50 MG tablet; Take 1 tablet (50 mg total) by mouth every 8 (eight) hours as needed for up to 5 days for moderate pain.  Dispense: 15 tablet; Refill: 0 - Pt presents with chest wall pain s/p lifting injury. PE and HPI suggests MSK pain. Plan to treat supportively with flexeril or Tramadol. Medication side effects and dosing discussed with pt. RTC if symptoms worsen/do not improve.    Mercer Pod, PA-C  [facility] 02/04/2018 1:50 PM

## 2018-02-09 ENCOUNTER — Other Ambulatory Visit: Payer: Self-pay

## 2018-02-09 ENCOUNTER — Ambulatory Visit (INDEPENDENT_AMBULATORY_CARE_PROVIDER_SITE_OTHER): Payer: 59 | Admitting: Physician Assistant

## 2018-02-09 ENCOUNTER — Encounter: Payer: Self-pay | Admitting: Physician Assistant

## 2018-02-09 VITALS — BP 114/76 | HR 86 | Temp 98.1°F | Resp 16 | Ht 61.75 in | Wt 215.2 lb

## 2018-02-09 DIAGNOSIS — Z13228 Encounter for screening for other metabolic disorders: Secondary | ICD-10-CM | POA: Diagnosis not present

## 2018-02-09 DIAGNOSIS — Z130 Encounter for screening for diseases of the blood and blood-forming organs and certain disorders involving the immune mechanism: Secondary | ICD-10-CM

## 2018-02-09 DIAGNOSIS — Z1321 Encounter for screening for nutritional disorder: Secondary | ICD-10-CM | POA: Diagnosis not present

## 2018-02-09 DIAGNOSIS — Z13220 Encounter for screening for lipoid disorders: Secondary | ICD-10-CM

## 2018-02-09 DIAGNOSIS — Z0000 Encounter for general adult medical examination without abnormal findings: Secondary | ICD-10-CM

## 2018-02-09 DIAGNOSIS — F17200 Nicotine dependence, unspecified, uncomplicated: Secondary | ICD-10-CM

## 2018-02-09 DIAGNOSIS — Z1329 Encounter for screening for other suspected endocrine disorder: Secondary | ICD-10-CM | POA: Diagnosis not present

## 2018-02-09 DIAGNOSIS — D229 Melanocytic nevi, unspecified: Secondary | ICD-10-CM | POA: Diagnosis not present

## 2018-02-09 LAB — POCT URINALYSIS DIP (MANUAL ENTRY)
Bilirubin, UA: NEGATIVE
Glucose, UA: NEGATIVE mg/dL
Ketones, POC UA: NEGATIVE mg/dL
Leukocytes, UA: NEGATIVE
Nitrite, UA: NEGATIVE
Protein Ur, POC: NEGATIVE mg/dL
Spec Grav, UA: 1.025 (ref 1.010–1.025)
Urobilinogen, UA: 0.2 E.U./dL
pH, UA: 5.5 (ref 5.0–8.0)

## 2018-02-09 NOTE — Progress Notes (Signed)
[facility] at [place], [place] [address] [phone]  Date:  02/09/2018   Name:  Julia Garcia   DOB:  Oct 17, 1970   MRN:  [id number]  PCP:  Dorise Hiss, PA-C    Chief Complaint: Annual Exam (GYN appt sche next month)   History of Present Illness:  This is a pleasant 47 y.o. female with PMH allergies, anxiety, arthritis who is presenting for CPE. Last CPE >15 years ago.  She works Estate agent at [employer].  She is fasting today.   Complaints:  none LMP: May 23. Periods are every 21 days. Becoming less frequent. Has been told by GYN that she is perimenopausal.   Contraception: none  Last pap: she has GYN appt next month for PAP.  Sexual history: married x 19 years. 51 year old son.  Immunizations: needs tdap  Dentist: regular check-ups.  Eye: 20/13 b/l  Diet/Exercise: diet is "horrible".  Walks dogs 3x/day. 8,000-10,000 steps/day.  Fam hx: family history includes Cancer in her father, maternal grandfather, and mother. Tobacco/alcohol/substance use: current every day smoker with a 16-pack year history. Drinks 2-3 drinks/month.   Mammogram: 2017 - negative. sched next MM next month.   Review of Systems:  Review of Systems  Constitutional: Negative for chills, diaphoresis, fatigue and fever.  HENT: Negative for congestion, postnasal drip, rhinorrhea, sinus pressure, sneezing and sore throat.   Respiratory: Negative for cough, chest tightness, shortness of breath and wheezing.   Cardiovascular: Negative for chest pain and palpitations.  Gastrointestinal: Negative for abdominal pain, diarrhea, nausea and vomiting.  Genitourinary: Negative for decreased urine volume, difficulty urinating, dysuria, enuresis, flank pain, frequency, hematuria and urgency.  Musculoskeletal: Negative for back pain.  Neurological: Negative for dizziness, weakness, light-headedness and headaches.    There are no active problems to display for this patient.   Prior  to Admission medications   Medication Sig Start Date End Date Taking? Authorizing Provider  cyclobenzaprine (FLEXERIL) 10 MG tablet Take 1 tablet (10 mg total) by mouth 3 (three) times daily as needed for muscle spasms. 02/04/18  Yes Shalane Florendo, Gelene Mink, PA-C  traMADol (ULTRAM) 50 MG tablet Take 1 tablet (50 mg total) by mouth every 8 (eight) hours as needed for up to 5 days for moderate pain. 02/04/18 02/09/18 Yes Salim Forero, Gelene Mink, PA-C    Allergies  Allergen Reactions  . Latex Swelling  . Zithromax [Azithromycin] Swelling    Gets blisters filled with fluid    Past Surgical History:  Procedure Laterality Date  . CESAREAN SECTION      Social History   Tobacco Use  . Smoking status: Current Every Day Smoker    Packs/day: 0.80    Years: 20.00    Pack years: 16.00    Types: Cigarettes  . Smokeless tobacco: Never Used  Substance Use Topics  . Alcohol use: No  . Drug use: No    Family History  Problem Relation Age of Onset  . Cancer Mother        Lung and throat  . Cancer Father        Prostate  . Cancer Maternal Grandfather     Medication list has been reviewed and updated.  Physical Examination:  Physical Exam  Constitutional: She is oriented to person, place, and time. She appears well-developed and well-nourished. No distress.  HENT:  Head: Normocephalic and atraumatic.  Mouth/Throat: Oropharynx is clear and moist.  Eyes: Pupils are equal, round, and reactive to light. Conjunctivae and EOM are normal.  Neck: Normal range of motion. Neck supple. No thyromegaly present.  Cardiovascular: Normal rate, regular rhythm and normal heart sounds.  No murmur heard. Pulmonary/Chest: Effort normal and breath sounds normal. She has no wheezes.  Abdominal: Soft. Bowel sounds are normal. She exhibits no mass.  Musculoskeletal: Normal range of motion.  Neurological: She is alert and oriented to person, place, and time. She has normal reflexes.  Skin: Skin is warm and  dry.  Multiple nevus on torso. No suspicious lesions appreciated.   Psychiatric: She has a normal mood and affect. Her behavior is normal. Judgment and thought content normal.  Vitals reviewed.   BP 114/76 (BP Location: Left Arm, Patient Position: Sitting, Cuff Size: Normal)   Pulse 86   Temp 98.1 F (36.7 C) (Oral)   Resp 16   Ht 5' 1.75" (1.568 m)   Wt 215 lb 3.2 oz (97.6 kg)   LMP 02/03/2018   SpO2 96%   BMI 39.68 kg/m   Results for orders placed or performed in visit on 02/09/18  POCT urinalysis dipstick  Result Value Ref Range   Color, UA yellow yellow   Clarity, UA clear clear   Glucose, UA negative negative mg/dL   Bilirubin, UA negative negative   Ketones, POC UA negative negative mg/dL   Spec Grav, UA 1.025 1.010 - 1.025   Blood, UA moderate (A) negative   pH, UA 5.5 5.0 - 8.0   Protein Ur, POC negative negative mg/dL   Urobilinogen, UA 0.2 0.2 or 1.0 E.U./dL   Nitrite, UA Negative Negative   Leukocytes, UA Negative Negative    Assessment and Plan: 1. Annual physical exam -Patient presents for her first annual exam in over 15 years.  She has a mammogram and Pap scheduled for next month.  Discussed smoking cessation, healthy weight and improved lifestyle.  Routine labs are pending, will contact with results. Health maintenance discussed and printed out for pt. Plan to send to derm for routine skin check.  2. Screening for endocrine, nutritional, metabolic and immunity disorder - CBC with Differential/Platelet - TSH - CMP14+EGFR - POCT urinalysis dipstick - VITAMIN D 25 Hydroxy (Vit-D Deficiency, Fractures)  3. Screening, lipid - Lipid panel  4. Nevus - Ambulatory referral to Dermatology 5. Current every day smoker - Smoking cessation discussed. She is not ready to quit.   Mercer Pod, PA-C  [facility] 02/09/2018 8:51 AM

## 2018-02-09 NOTE — Patient Instructions (Addendum)
Try cutting back to 1/2 pack cigarettes/day!   You will receive a phone call to schedule an appointment with dermatology.    We will contact you with the results of your labs.    ACTIVITY/FITNESS Keep moving your body to the best of your abilities. You can do this at home, inside or outside, the park, community center, gym or anywhere you like. Consider the app Sworkit. Fitness trackers such as smart-watches, smart-phones or Fitbits can help as well.  RELAXATION Consider practicing mindfulness meditation or other relaxation techniques such as deep breathing, prayer, yoga, tai chi, massage. See website mindful.org or the apps Headspace or Calm to help get started.  NUTRITION Eat more plants: colorful vegetables, nuts, seeds and berries.  Eat less sugar, salt, preservatives and processed foods.  Avoid toxins such as cigarettes and alcohol.  Drink water when you are thirsty. Warm water with a slice of lemon is an excellent morning drink to start the day.  Consider these websites for more information The Nutrition Source (https://www.henry-hernandez.biz/) Precision Nutrition (WindowBlog.ch)  Try to shop mostly along the perimeter of the grocery store. Cut down consumption of processed foods.   The following foods are the foundation of a heart-healthy diet: Vegetables such as greens (spinach, collard greens, kale), broccoli, cabbage, carrots, bell peppers; stay away from starchy vegetables like potatoes, carrots, peas Fruits such as avocados, apples, berries, bananas, oranges, pears, grapes, and prunes  Whole grains such as plain oatmeal, brown rice, and whole-grain bread or tortillas  Fat-free or low-fat dairy foods such as milk, cheese, or yogurt  Protein-rich foods:  Fish high in omega-3 fatty acids, such as salmon, tuna, and trout, about 8 ounces a week  Lean meats such as 95 percent lean ground beef or pork tenderloin  Poultry  such as skinless chicken or [place]  Eggs  Nuts, seeds, and soy products: quinoa, chia seeds Legumes such as kidney beans, lentils, chickpeas, black-eyed peas, and lima beans Oils and foods containing high levels of monounsaturated and polyunsaturated fats that can help lower blood cholesterol levels and the risk of cardiovascular disease. Some sources of these oils are:  Canola, corn, olive, safflower, sesame, sunflower, and soybean oils  Nuts such as walnuts, almonds, and pine nuts  Nut and seed butters  Salmon and trout  Seeds such as sesame, sunflower, pumpkin, or flax  Avocados  Tofu  Recipe ideas: [employer], [employer], Lung, and [place], allrecipes.com, wholefoodsmarket.com  Limit added sugars When you follow a heart-healthy eating plan, you should limit the amount of calories you consume each day from added sugars. Because added sugars do not provide essential nutrients and are extra calories, limiting them can help you choose nutrient-rich foods and stay within your daily calorie limit. Some foods, such as fruit, contain natural sugars. Added sugars do not occur naturally in foods, but instead are used to sweeten foods and drinks. Some examples of added sugars include brown sugar, corn syrup, dextrose, fructose, glucose, high-fructose corn syrup, raw sugar, and sucrose. In the [place], sweetened drinks, snacks, and sweets are the major sources of added sugars. Sweetened drinks account for about half of all added sugars consumed. The following are examples of foods and drinks with added sugars. Sweetened drinks include soft drinks or sodas, fruit drinks, sweetened coffee and tea, energy drinks, alcoholic drinks, and favored waters.  Snacks and sweets include grain-based desserts such as cakes, pies, cookies, brownies, doughnuts; dairy desserts such as ice cream, frozen desserts, and pudding; candies;  sugars; jams; syrups; and sweet toppings. To help you reduce  the amount of added sugars in your diet: Choose unsweetened or whole fruits for snacks or dessert.  Choose drinks without added sugar such as water, low-fat or fat-free milk, or 100 percent fruit or vegetable juice.  Limit intake of sweetened drinks, snacks and desserts by eating them less often and in smaller amounts.   Health Maintenance, Female Adopting a healthy lifestyle and getting preventive care can go a long way to promote health and wellness. Talk with your health care provider about what schedule of regular examinations is right for you. This is a good chance for you to check in with your provider about disease prevention and staying healthy. In between checkups, there are plenty of things you can do on your own. Experts have done a lot of research about which lifestyle changes and preventive measures are most likely to keep you healthy. Ask your health care provider for more information. Weight and diet Eat a healthy diet  Be sure to include plenty of vegetables, fruits, low-fat dairy products, and lean protein.  Do not eat a lot of foods high in solid fats, added sugars, or salt.  Get regular exercise. This is one of the most important things you can do for your health. ? Most adults should exercise for at least 150 minutes each week. The exercise should increase your heart rate and make you sweat (moderate-intensity exercise). ? Most adults should also do strengthening exercises at least twice a week. This is in addition to the moderate-intensity exercise.  Maintain a healthy weight  Body mass index (BMI) is a measurement that can be used to identify possible weight problems. It estimates body fat based on height and weight. Your health care provider can help determine your BMI and help you achieve or maintain a healthy weight.  For females 18 years of age and older: ? A BMI below 18.5 is considered underweight. ? A BMI of 18.5 to 24.9 is normal. ? A BMI of 25 to 29.9 is  considered overweight. ? A BMI of 30 and above is considered obese.  Watch levels of cholesterol and blood lipids  You should start having your blood tested for lipids and cholesterol at 47 years of age, then have this test every 5 years.  You may need to have your cholesterol levels checked more often if: ? Your lipid or cholesterol levels are high. ? You are older than 47 years of age. ? You are at high risk for heart disease.  Cancer screening Lung Cancer  Lung cancer screening is recommended for adults 52-36 years old who are at high risk for lung cancer because of a history of smoking.  A yearly low-dose CT scan of the lungs is recommended for people who: ? Currently smoke. ? Have quit within the past 15 years. ? Have at least a 30-pack-year history of smoking. A pack year is smoking an average of one pack of cigarettes a day for 1 year.  Yearly screening should continue until it has been 15 years since you quit.  Yearly screening should stop if you develop a health problem that would prevent you from having lung cancer treatment.  Breast Cancer  Practice breast self-awareness. This means understanding how your breasts normally appear and feel.  It also means doing regular breast self-exams. Let your health care provider know about any changes, no matter how small.  If you are in your 20s or 30s, you should  have a clinical breast exam (CBE) by a health care provider every 1-3 years as part of a regular health exam.  If you are 80 or older, have a CBE every year. Also consider having a breast X-ray (mammogram) every year.  If you have a family history of breast cancer, talk to your health care provider about genetic screening.  If you are at high risk for breast cancer, talk to your health care provider about having an MRI and a mammogram every year.  Breast cancer gene (BRCA) assessment is recommended for women who have family members with BRCA-related cancers.  BRCA-related cancers include: ? Breast. ? Ovarian. ? Tubal. ? Peritoneal cancers.  Results of the assessment will determine the need for genetic counseling and BRCA1 and BRCA2 testing.  Cervical Cancer Your health care provider may recommend that you be screened regularly for cancer of the pelvic organs (ovaries, uterus, and vagina). This screening involves a pelvic examination, including checking for microscopic changes to the surface of your cervix (Pap test). You may be encouraged to have this screening done every 3 years, beginning at age 59.  For women ages 47-65, health care providers may recommend pelvic exams and Pap testing every 3 years, or they may recommend the Pap and pelvic exam, combined with testing for human papilloma virus (HPV), every 5 years. Some types of HPV increase your risk of cervical cancer. Testing for HPV may also be done on women of any age with unclear Pap test results.  Other health care providers may not recommend any screening for nonpregnant women who are considered low risk for pelvic cancer and who do not have symptoms. Ask your health care provider if a screening pelvic exam is right for you.  If you have had past treatment for cervical cancer or a condition that could lead to cancer, you need Pap tests and screening for cancer for at least 20 years after your treatment. If Pap tests have been discontinued, your risk factors (such as having a new sexual partner) need to be reassessed to determine if screening should resume. Some women have medical problems that increase the chance of getting cervical cancer. In these cases, your health care provider may recommend more frequent screening and Pap tests.  Colorectal Cancer  This type of cancer can be detected and often prevented.  Routine colorectal cancer screening usually begins at 47 years of age and continues through 47 years of age.  Your health care provider may recommend screening at an earlier age if  you have risk factors for colon cancer.  Your health care provider may also recommend using home test kits to check for hidden blood in the stool.  A small camera at the end of a tube can be used to examine your colon directly (sigmoidoscopy or colonoscopy). This is done to check for the earliest forms of colorectal cancer.  Routine screening usually begins at age 99.  Direct examination of the colon should be repeated every 5-10 years through 47 years of age. However, you may need to be screened more often if early forms of precancerous polyps or small growths are found.  Skin Cancer  Check your skin from head to toe regularly.  Tell your health care provider about any new moles or changes in moles, especially if there is a change in a mole's shape or color.  Also tell your health care provider if you have a mole that is larger than the size of a pencil eraser.  Always  use sunscreen. Apply sunscreen liberally and repeatedly throughout the day.  Protect yourself by wearing long sleeves, pants, a wide-brimmed hat, and sunglasses whenever you are outside.  Heart disease, diabetes, and high blood pressure  High blood pressure causes heart disease and increases the risk of stroke. High blood pressure is more likely to develop in: ? People who have blood pressure in the high end of the normal range (130-139/85-89 mm Hg). ? People who are overweight or obese. ? People who are African American.  If you are 18-27 years of age, have your blood pressure checked every 3-5 years. If you are 32 years of age or older, have your blood pressure checked every year. You should have your blood pressure measured twice-once when you are at a hospital or clinic, and once when you are not at a hospital or clinic. Record the average of the two measurements. To check your blood pressure when you are not at a hospital or clinic, you can use: ? An automated blood pressure machine at a pharmacy. ? A home blood  pressure monitor.  If you are between 72 years and 79 years old, ask your health care provider if you should take aspirin to prevent strokes.  Have regular diabetes screenings. This involves taking a blood sample to check your fasting blood sugar level. ? If you are at a normal weight and have a low risk for diabetes, have this test once every three years after 47 years of age. ? If you are overweight and have a high risk for diabetes, consider being tested at a younger age or more often. Preventing infection Hepatitis B  If you have a higher risk for hepatitis B, you should be screened for this virus. You are considered at high risk for hepatitis B if: ? You were born in a country where hepatitis B is common. Ask your health care provider which countries are considered high risk. ? Your parents were born in a high-risk country, and you have not been immunized against hepatitis B (hepatitis B vaccine). ? You have HIV or AIDS. ? You use needles to inject street drugs. ? You live with someone who has hepatitis B. ? You have had sex with someone who has hepatitis B. ? You get hemodialysis treatment. ? You take certain medicines for conditions, including cancer, organ transplantation, and autoimmune conditions.  Hepatitis C  Blood testing is recommended for: ? Everyone born from [birth year] through 1965. ? Anyone with known risk factors for hepatitis C.  Sexually transmitted infections (STIs)  You should be screened for sexually transmitted infections (STIs) including gonorrhea and chlamydia if: ? You are sexually active and are younger than 47 years of age. ? You are older than 47 years of age and your health care provider tells you that you are at risk for this type of infection. ? Your sexual activity has changed since you were last screened and you are at an increased risk for chlamydia or gonorrhea. Ask your health care provider if you are at risk.  If you do not have HIV, but are at risk,  it may be recommended that you take a prescription medicine daily to prevent HIV infection. This is called pre-exposure prophylaxis (PrEP). You are considered at risk if: ? You are sexually active and do not regularly use condoms or know the HIV status of your partner(s). ? You take drugs by injection. ? You are sexually active with a partner who has HIV.  Talk with your  health care provider about whether you are at high risk of being infected with HIV. If you choose to begin PrEP, you should first be tested for HIV. You should then be tested every 3 months for as long as you are taking PrEP. Pregnancy  If you are premenopausal and you may become pregnant, ask your health care provider about preconception counseling.  If you may become pregnant, take 400 to 800 micrograms (mcg) of folic acid every day.  If you want to prevent pregnancy, talk to your health care provider about birth control (contraception). Osteoporosis and menopause  Osteoporosis is a disease in which the bones lose minerals and strength with aging. This can result in serious bone fractures. Your risk for osteoporosis can be identified using a bone density scan.  If you are 79 years of age or older, or if you are at risk for osteoporosis and fractures, ask your health care provider if you should be screened.  Ask your health care provider whether you should take a calcium or vitamin D supplement to lower your risk for osteoporosis.  Menopause may have certain physical symptoms and risks.  Hormone replacement therapy may reduce some of these symptoms and risks. Talk to your health care provider about whether hormone replacement therapy is right for you. Follow these instructions at home:  Schedule regular health, dental, and eye exams.  Stay current with your immunizations.  Do not use any tobacco products including cigarettes, chewing tobacco, or electronic cigarettes.  If you are pregnant, do not drink  alcohol.  If you are breastfeeding, limit how much and how often you drink alcohol.  Limit alcohol intake to no more than 1 drink per day for nonpregnant women. One drink equals 12 ounces of beer, 5 ounces of wine, or 1 ounces of hard liquor.  Do not use street drugs.  Do not share needles.  Ask your health care provider for help if you need support or information about quitting drugs.  Tell your health care provider if you often feel depressed.  Tell your health care provider if you have ever been abused or do not feel safe at home. This information is not intended to replace advice given to you by your health care provider. Make sure you discuss any questions you have with your health care provider. Document Released: 03/16/2011 Document Revised: 02/06/2016 Document Reviewed: 06/04/2015 Elsevier Interactive Patient Education  2018 [employer].  IF you received an x-ray today, you will receive an invoice from [facility]. Please contact [facility] at [phone] with questions or concerns regarding your invoice.   IF you received labwork today, you will receive an invoice from [facility] [facility]. Please contact [facility] at [phone] with questions or concerns regarding your invoice.   Our billing staff will not be able to assist you with questions regarding bills from these companies.  You will be contacted with the lab results as soon as they are available. The fastest way to get your results is to activate your My Chart account. Instructions are located on the last page of this paperwork. If you have not heard from [place] regarding the results in 2 weeks, please contact this office.

## 2018-02-10 LAB — CMP14+EGFR
ALT: 11 IU/L (ref 0–32)
AST: 13 IU/L (ref 0–40)
Albumin/Globulin Ratio: 1.5 (ref 1.2–2.2)
Albumin: 4 g/dL (ref 3.5–5.5)
Alkaline Phosphatase: 75 IU/L (ref 39–117)
BUN/Creatinine Ratio: 13 (ref 9–23)
BUN: 8 mg/dL (ref 6–24)
Bilirubin Total: 0.2 mg/dL (ref 0.0–1.2)
CO2: 19 mmol/L — ABNORMAL LOW (ref 20–29)
Calcium: 8.4 mg/dL — ABNORMAL LOW (ref 8.7–10.2)
Chloride: 105 mmol/L (ref 96–106)
Creatinine, Ser: 0.6 mg/dL (ref 0.57–1.00)
GFR calc Af Amer: 126 mL/min/{1.73_m2} (ref >59)
GFR calc non Af Amer: 109 mL/min/{1.73_m2} (ref >59)
Globulin, Total: 2.6 g/dL (ref 1.5–4.5)
Glucose: 83 mg/dL (ref 65–99)
Potassium: 4.2 mmol/L (ref 3.5–5.2)
Sodium: 138 mmol/L (ref 134–144)
Total Protein: 6.6 g/dL (ref 6.0–8.5)

## 2018-02-10 LAB — CBC WITH DIFFERENTIAL/PLATELET
Basophils Absolute: 0 10*3/uL (ref 0.0–0.2)
Basos: 0 %
EOS (ABSOLUTE): 0.1 10*3/uL (ref 0.0–0.4)
Eos: 1 %
Hematocrit: 39.2 % (ref 34.0–46.6)
Hemoglobin: 12.5 g/dL (ref 11.1–15.9)
Immature Grans (Abs): 0 10*3/uL (ref 0.0–0.1)
Immature Granulocytes: 0 %
Lymphocytes Absolute: 1.8 10*3/uL (ref 0.7–3.1)
Lymphs: 22 %
MCH: 27.9 pg (ref 26.6–33.0)
MCHC: 31.9 g/dL (ref 31.5–35.7)
MCV: 88 fL (ref 79–97)
Monocytes Absolute: 0.5 10*3/uL (ref 0.1–0.9)
Monocytes: 6 %
Neutrophils Absolute: 5.6 10*3/uL (ref 1.4–7.0)
Neutrophils: 71 %
Platelets: 257 10*3/uL (ref 150–450)
RBC: 4.48 x10E6/uL (ref 3.77–5.28)
RDW: 14.9 % (ref 12.3–15.4)
WBC: 8.1 10*3/uL (ref 3.4–10.8)

## 2018-02-10 LAB — LIPID PANEL
Chol/HDL Ratio: 4.4 ratio (ref 0.0–4.4)
Cholesterol, Total: 184 mg/dL (ref 100–199)
HDL: 42 mg/dL (ref >39)
LDL Calculated: 114 mg/dL — ABNORMAL HIGH (ref 0–99)
Triglycerides: 139 mg/dL (ref 0–149)
VLDL Cholesterol Cal: 28 mg/dL (ref 5–40)

## 2018-02-10 LAB — TSH: TSH: 2.01 u[IU]/mL (ref 0.450–4.500)

## 2018-02-10 LAB — VITAMIN D 25 HYDROXY (VIT D DEFICIENCY, FRACTURES): Vit D, 25-Hydroxy: 13.3 ng/mL — ABNORMAL LOW (ref 30.0–100.0)

## 2018-02-24 DIAGNOSIS — Z01419 Encounter for gynecological examination (general) (routine) without abnormal findings: Secondary | ICD-10-CM | POA: Diagnosis not present

## 2018-02-24 DIAGNOSIS — Z1231 Encounter for screening mammogram for malignant neoplasm of breast: Secondary | ICD-10-CM | POA: Diagnosis not present

## 2018-03-07 NOTE — Progress Notes (Signed)
Results released to mychart. Start vit d supplement. RTC in 3 months for recheck.

## 2018-07-05 ENCOUNTER — Ambulatory Visit: Payer: 59 | Admitting: Physician Assistant

## 2018-07-05 ENCOUNTER — Ambulatory Visit (INDEPENDENT_AMBULATORY_CARE_PROVIDER_SITE_OTHER): Payer: 59

## 2018-07-05 ENCOUNTER — Encounter: Payer: Self-pay | Admitting: Physician Assistant

## 2018-07-05 ENCOUNTER — Other Ambulatory Visit: Payer: Self-pay

## 2018-07-05 VITALS — BP 110/74 | HR 98 | Temp 99.7°F | Resp 16 | Ht 61.75 in | Wt 222.2 lb

## 2018-07-05 DIAGNOSIS — M25551 Pain in right hip: Secondary | ICD-10-CM | POA: Diagnosis not present

## 2018-07-05 DIAGNOSIS — M5410 Radiculopathy, site unspecified: Secondary | ICD-10-CM

## 2018-07-05 DIAGNOSIS — M4310 Spondylolisthesis, site unspecified: Secondary | ICD-10-CM | POA: Diagnosis not present

## 2018-07-05 DIAGNOSIS — M47817 Spondylosis without myelopathy or radiculopathy, lumbosacral region: Secondary | ICD-10-CM | POA: Diagnosis not present

## 2018-07-05 MED ORDER — CYCLOBENZAPRINE HCL 10 MG PO TABS: 10.0000 mg | ORAL_TABLET | Freq: Three times a day (TID) | ORAL | 1 refills | Status: DC | PRN

## 2018-07-05 NOTE — Patient Instructions (Addendum)
You will receive a phone call to schedule an appointment with physical therapy and orthopedics.    Flexeril is a muscle relaxer. This may make you drowsy.   Apply moist heat to the area. Wet a towel and wring it out so it is damp. Put it in the microwave for about 15-20 seconds - long enough to make it hot, but not too hot to apply to your skin causing burns. Do this for about 20-30 minutes, 3-4 times a day.   Perform gentle, light stretches 2-3 times a day.   Put a tennis ball between your back and a wall. Gentle massage the area with rolling the ball around the affected area. Try a foam roller.   Stay well hydrated - try to drink 32-64 oz/day.   Come back and see me if you would like to try a dry needling session. (see below).    Trigger Point Dry Needling   What is Trigger Point Dry Needling (DN)?   1. DN is a physical therapy technique used to treat muscle pain and Dysfunction.  Specifically, DN helps deactivate muscle trigger points (Muscle Knots).   2. A thin filiform needle is used to penetrate the skin and stimulate the underlying trigger point.  The goal is for a local twitch response (LTR) to occur and for the trigger point to relax.  No medication of any kind is injected during the procedure.   What Does Trigger Point Dry Needling Feel Like?   1. The procedures feels different for each individual patient.   Some patients report that they do not actually feel the needle enter the skin and overall the process is not  painful.  Very  mild bleeding may occur.  However, many patients feel a deep cramping in the muscle in which the needle was inserted. This is the local twitch response.    How Will I Feel After The Treatment?   1. Soreness is normal, and the onset of soreness may not occur for a few hours.  Typically this soreness does not last longer than two days.   2. Bruising is uncommon, however; ice can be used to decrease any possible bruising.   3. In rare cases feeling  tired or nauseous after the treatment is normal.  In addition, your symptoms may get worse before they get better, this period will typically not last longer than 24 hours.   What Can I do After My Treatment?   1.  Increase your hydration by drinking more water for the next 24 hours.   2.  You may place ice or heat on the areas treated that have become sore, however don not use heat on inflamed or bruised areas.  Heat often brings more relief post needling.   3. You can continue your regular activities, but vigorous activity is not recommended initially after the treatment for 24 hours.   4. DN is best combined with other physical therapy such as strengthening, stretching, and other therapies.   ------------------------------------------  Your vitamin D level is low. People who do not have enough vitamin D can have weak/soft bones, which can break easily or have weak muscles which makes you more likely to fall.  Your body also needs Vitamin D to absorb calcium.  Start taking Vitamin D3 (cholecalciferol) daily.   ? vitamin D levels of 10 to 20 ng/mL (yours is 13. Normal is >30) , initial supplementation with 800 to 1,000 international units daily may be sufficient. A repeat serum 25(OH)D  level should be obtained after approximately 3 months of therapy. If goal level is not achieved, higher doses may be necessary.    Calcium - All patients should maintain a daily total calcium intake (diet plus supplement) of 1000 mg (for ages 62 to 70 years) to 1200 mg (for women ages 77 through 4 years and all adults 36 years and older).    Foods and drinks with vitamin D: -Milk, orange juice, or yogurt with vitamin D added -Cooked salmon or mackerel -Canned tuna fish -Cereals with vitamin D added -Cod liver oil People's bodies can also get vitamin D from the sun. The body uses sunlight that shines on the skin to make vitamin D.

## 2018-07-05 NOTE — Progress Notes (Signed)
Julia Garcia  MRN: [id number] DOB: 1970/12/31  PCP: Dorise Hiss, PA-C  Subjective:  Pt is a 47 year old female who presents to clinic for hip and back pain x 4 weeks.  Back pain on her lower right side.  The last week and a half "It has been unbearable"  Last few days she has almost fallen bc her balance is off.  Flexeril has helped.  She has never been to PT or ortho for this problem.  Denies saddle paresthesia, change in bowel or bladder habits, urinary symptoms, muscle weakness.   Born hip dysplasia. (per pt)   Review of Systems  Genitourinary: Negative for dysuria, flank pain, frequency, hematuria and urgency.  Musculoskeletal: Positive for arthralgias and back pain.    Patient Active Problem List   Diagnosis Date Noted  . Current every day smoker 02/09/2018  . Pulled muscle 01/26/2018    Current Outpatient Medications on File Prior to Visit  Medication Sig Dispense Refill  . cyclobenzaprine (FLEXERIL) 10 MG tablet Take 1 tablet (10 mg total) by mouth 3 (three) times daily as needed for muscle spasms. (Patient not taking: Reported on 07/05/2018) 30 tablet 0   No current facility-administered medications on file prior to visit.     Allergies  Allergen Reactions  . Latex Swelling  . Zithromax [Azithromycin] Swelling    Gets blisters filled with fluid     Objective:  BP 110/74 (BP Location: Left Arm, Patient Position: Sitting, Cuff Size: Large)   Pulse 98   Temp 99.7 F (37.6 C) (Oral)   Resp 16   Ht 5' 1.75" (1.568 m)   Wt 222 lb 3.2 oz (100.8 kg)   LMP 06/24/2018   SpO2 96%   BMI 40.97 kg/m   Physical Exam  Constitutional: She appears well-developed and well-nourished.  Musculoskeletal:       Right hip: She exhibits normal range of motion, normal strength, no tenderness and no bony tenderness.       Left hip: She exhibits normal range of motion, normal strength, no tenderness and no bony tenderness.       Lumbar back: She exhibits  decreased range of motion, tenderness and bony tenderness.       Back:  TTP area indicated  Neurological: She has normal strength. No sensory deficit.  Negative SLR  Psychiatric: She has a normal mood and affect. Thought content normal.  Vitals reviewed.  Dg Lumbar Spine Complete  Result Date: 07/05/2018 CLINICAL DATA:  Low back pain for 1 month EXAM: LUMBAR SPINE - COMPLETE 4+ VIEW COMPARISON:  None. FINDINGS: 10 mm anterolisthesis L4 on L5. Bilateral pars defect at L4. Vertebral body heights are normal. Moderate degenerative change at L4-L5 and L5-S1. IMPRESSION: 1. 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 Moderate degenerative changes. Electronically Signed   By: Donavan Foil M.D.   On: 07/05/2018 16:58   Dg Hip Unilat W Or W/o Pelvis 2-3 Views Right  Result Date: 07/05/2018 CLINICAL DATA:  Right-sided hip pain EXAM: DG HIP (WITH OR WITHOUT PELVIS) 2-3V RIGHT COMPARISON:  None. FINDINGS: Mild SI joint degenerative change. Pubic symphysis and rami appear intact. IMPRESSION: Negative. Electronically Signed   By: Donavan Foil M.D.   On: 07/05/2018 17:06   A dry needling session was performed at the site of maximal tenderness. This was well tolerated, and followed by 40% relief of pain.  Assessment and Plan :  1. Back pain with right-sided radiculopathy - Pt  presents for worsening lower back pain. No red flags. Lumbar x-ray shows 10 mm anterolisthesis L4 on L5 with bilateral pars defect at L4. Plan to refer to ortho for evaluation.  She endorses 40% better after dry needling session. Advised heat, stretching, massage, flexeril. Plan to send to PT - DG Lumbar Spine Complete; Future - DG HIP UNILAT W OR W/O PELVIS 2-3 VIEWS RIGHT; Future - Ambulatory referral to Physical Therapy - cyclobenzaprine (FLEXERIL) 10 MG tablet; Take 1 tablet (10 mg total) by mouth 3 (three) times daily as needed for muscle spasms.  Dispense: 30 tablet; Refill: 1  2. Anterolisthesis - -  Ambulatory referral to Orthopedic Surgery    Mercer Pod, PA-C  [facility] 07/05/2018 4:16 PM  Please note: Portions of this report may have been transcribed using dragon voice recognition software. Every effort was made to ensure accuracy; however, inadvertent computerized transcription errors may be present.

## 2018-07-12 DIAGNOSIS — M25552 Pain in left hip: Secondary | ICD-10-CM | POA: Diagnosis not present

## 2018-07-12 DIAGNOSIS — M545 Low back pain: Secondary | ICD-10-CM | POA: Diagnosis not present

## 2018-07-12 DIAGNOSIS — M25551 Pain in right hip: Secondary | ICD-10-CM | POA: Diagnosis not present

## 2018-07-15 DIAGNOSIS — M25552 Pain in left hip: Secondary | ICD-10-CM | POA: Diagnosis not present

## 2018-07-15 DIAGNOSIS — M545 Low back pain: Secondary | ICD-10-CM | POA: Diagnosis not present

## 2018-07-15 DIAGNOSIS — M25551 Pain in right hip: Secondary | ICD-10-CM | POA: Diagnosis not present

## 2018-07-19 DIAGNOSIS — M545 Low back pain: Secondary | ICD-10-CM | POA: Diagnosis not present

## 2018-07-19 DIAGNOSIS — M25551 Pain in right hip: Secondary | ICD-10-CM | POA: Diagnosis not present

## 2018-07-19 DIAGNOSIS — M25552 Pain in left hip: Secondary | ICD-10-CM | POA: Diagnosis not present

## 2018-07-20 ENCOUNTER — Ambulatory Visit (INDEPENDENT_AMBULATORY_CARE_PROVIDER_SITE_OTHER): Payer: 59 | Admitting: Orthopedic Surgery

## 2018-07-20 ENCOUNTER — Encounter (INDEPENDENT_AMBULATORY_CARE_PROVIDER_SITE_OTHER): Payer: Self-pay | Admitting: Orthopedic Surgery

## 2018-07-20 VITALS — BP 119/76 | HR 88 | Resp 16 | Ht 61.75 in | Wt 222.0 lb

## 2018-07-20 DIAGNOSIS — M545 Low back pain: Secondary | ICD-10-CM

## 2018-07-20 DIAGNOSIS — M4316 Spondylolisthesis, lumbar region: Secondary | ICD-10-CM
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
## 2018-07-21 DIAGNOSIS — M25551 Pain in right hip: Secondary | ICD-10-CM | POA: Diagnosis not present

## 2018-07-21 DIAGNOSIS — M25552 Pain in left hip: Secondary | ICD-10-CM | POA: Diagnosis not present

## 2018-07-21 DIAGNOSIS — M545 Low back pain: Secondary | ICD-10-CM | POA: Diagnosis not present

## 2018-08-02 ENCOUNTER — Ambulatory Visit
Admission: RE | Admit: 2018-08-02 | Discharge: 2018-08-02 | Disposition: A | Discharge status: Home / Self Care | Payer: 59 | Source: Ambulatory Visit | Attending: Orthopedic Surgery | Admitting: Orthopedic Surgery

## 2018-08-02 DIAGNOSIS — M48061 Spinal stenosis, lumbar region without neurogenic claudication: Secondary | ICD-10-CM | POA: Diagnosis not present

## 2018-08-02 DIAGNOSIS — M4316 Spondylolisthesis, lumbar region: Secondary | ICD-10-CM

## 2018-08-05 ENCOUNTER — Ambulatory Visit (INDEPENDENT_AMBULATORY_CARE_PROVIDER_SITE_OTHER): Payer: 59 | Admitting: Orthopaedic Surgery

## 2018-08-05 ENCOUNTER — Encounter (INDEPENDENT_AMBULATORY_CARE_PROVIDER_SITE_OTHER): Payer: Self-pay | Admitting: Orthopaedic Surgery

## 2018-08-05 VITALS — BP 119/83 | HR 71 | Ht 61.75 in | Wt 222.0 lb

## 2018-08-05 DIAGNOSIS — M545 Low back pain: Secondary | ICD-10-CM | POA: Diagnosis not present

## 2018-08-05 DIAGNOSIS — G8929 Other chronic pain: Secondary | ICD-10-CM | POA: Diagnosis not present

## 2018-08-05 DIAGNOSIS — Z6841 Body Mass Index (BMI) 40.0 and over, adult: Secondary | ICD-10-CM

## 2018-08-05 NOTE — Progress Notes (Signed)
Office Visit Note   Patient: Julia Garcia           Date of Birth: [birth year]           MRN: [id number] Visit Date: 08/05/2018              Requested by: Dorise Hiss, PA-C [place], [place] [address] PCP: Dorise Hiss, PA-C   Assessment & Plan: Visit Diagnoses:  1. Chronic bilateral low back pain without sciatica     Plan: MRI scan demonstrates 8 mm of anterior listhesis of L4 on L5 with diffuse bulging of the disc.  Bilateral pars defects at L4 with bony overgrowth.  6 mm cyst projecting into the left lateral canal and subarticular zone appears related to the pars defect.  This could affect the left L5 nerve root.  Patient is not symptomatic spinal canal is normal in size.  Other levels are basically negative.  Discussion regarding MRI scan findings.  Has had prior dry needling, physical therapy and muscle relaxants which have "helped".  Recently doing well.  Long discussion regarding what she may expect over time and the need to continue her exercises and take over-the-counter medicines as needed.  Long discussion regarding her weight and how that will impact her back and lower extremities over time  Follow-Up Instructions: Return if symptoms worsen or fail to improve.   Orders:  No orders of the defined types were placed in this encounter.  No orders of the defined types were placed in this encounter.     Procedures: No procedures performed   Clinical Data: No additional findings.   Subjective: Chief Complaint  Patient presents with  . Lower Back - Pain, Follow-up    MRI Lumbar Review  Patient returns to review MRI Lumbar. She has done well with physical therapy and is now doing home exercises. She did have dry needling from her primary care physician which helped her symptoms tremendously.  She takes flexeril as needed. Low back pain with occasional discomfort of both hips.  Has a prior history of hip dislocation as a young child.   Not having groin or thigh pain.  No numbness or tingling.  Dry needling, therapy and muscle relaxants have helped her in the past when she is symptomatic.  No bowel or bladder dysfunction. HPI  Review of Systems   Objective: Vital Signs: BP 119/83   Pulse 71   Ht 5' 1.75" (1.568 m)   Wt 222 lb (100.7 kg)   LMP 07/12/2018   BMI 40.93 kg/m   Physical Exam  Constitutional: She is oriented to person, place, and time. She appears well-developed and well-nourished.  HENT:  Mouth/Throat: Oropharynx is clear and moist.  Eyes: Pupils are equal, round, and reactive to light. EOM are normal.  Pulmonary/Chest: Effort normal.  Neurological: She is alert and oriented to person, place, and time.  Skin: Skin is warm and dry.  Psychiatric: She has a normal mood and affect. Her behavior is normal.    Ortho Exam awake alert and oriented x3.  Comfortable sitting.  No pain with range of motion of either hip.  Straight leg raise negative.  Neurologically intact.  No pain or over either greater trochanters or lateral hips.  No percussible tenderness of the lumbar spine.  Walks without a limp  Specialty Comments:  No specialty comments available.  Imaging: No results found.   PMFS History: Patient Active Problem List   Diagnosis Date Noted  . Chronic  bilateral low back pain without sciatica 08/05/2018  . Current every day smoker 02/09/2018  . Pulled muscle 01/26/2018   Past Medical History:  Diagnosis Date  . Allergy   . Anxiety   . Arthritis   . Migraine     Family History  Problem Relation Age of Onset  . Cancer Mother        Lung and throat  . Cancer Father        Prostate  . Cancer Maternal Grandfather     Past Surgical History:  Procedure Laterality Date  . CESAREAN SECTION     Social History   Occupational History  . Not on file  Tobacco Use  . Smoking status: Current Every Day Smoker    Packs/day: 0.80    Years: 20.00    Pack years: 16.00    Types: Cigarettes  .  Smokeless tobacco: Never Used  Substance and Sexual Activity  . Alcohol use: No  . Drug use: No  . Sexual activity: Yes    Partners: Male

## 2019-05-24 ENCOUNTER — Encounter: Payer: Self-pay | Admitting: Registered Nurse

## 2019-05-24 ENCOUNTER — Ambulatory Visit: Payer: 59 | Admitting: Registered Nurse

## 2019-05-24 ENCOUNTER — Other Ambulatory Visit: Payer: Self-pay

## 2019-05-24 VITALS — BP 121/75 | HR 83 | Temp 99.0°F | Resp 16 | Wt 196.0 lb

## 2019-05-24 DIAGNOSIS — F5104 Psychophysiologic insomnia: Secondary | ICD-10-CM | POA: Diagnosis not present

## 2019-05-24 DIAGNOSIS — M542 Cervicalgia: Secondary | ICD-10-CM | POA: Insufficient documentation

## 2019-05-24 MED ORDER — TRAZODONE HCL 50 MG PO TABS: 25.0000 mg | ORAL_TABLET | Freq: Every evening | ORAL | 0 refills | Status: DC | PRN
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
## 2019-05-24 NOTE — Patient Instructions (Signed)
° ° ° °  If you have lab work done today you will be contacted with your lab results within the next 2 weeks.  If you have not heard from us then please contact us. The fastest way to get your results is to register for My Chart. ° ° °IF you received an x-ray today, you will receive an invoice from [facility]. Please contact [facility] at [phone] with questions or concerns regarding your invoice.  ° °IF you received labwork today, you will receive an invoice from [facility]. Please contact [facility] at [phone] with questions or concerns regarding your invoice.  ° °Our billing staff will not be able to assist you with questions regarding bills from these companies. ° °You will be contacted with the lab results as soon as they are available. The fastest way to get your results is to activate your My Chart account. Instructions are located on the last page of this paperwork. If you have not heard from us regarding the results in 2 weeks, please contact this office. °  ° ° ° °

## 2019-06-07 ENCOUNTER — Other Ambulatory Visit: Payer: Self-pay

## 2019-06-07 ENCOUNTER — Encounter: Payer: Self-pay | Admitting: Registered Nurse

## 2019-06-07 ENCOUNTER — Ambulatory Visit: Payer: 59 | Admitting: Registered Nurse

## 2019-06-07 DIAGNOSIS — M542 Cervicalgia: Secondary | ICD-10-CM | POA: Diagnosis not present

## 2019-06-07 NOTE — Patient Instructions (Signed)
° ° ° °  If you have lab work done today you will be contacted with your lab results within the next 2 weeks.  If you have not heard from us then please contact us. The fastest way to get your results is to register for My Chart. ° ° °IF you received an x-ray today, you will receive an invoice from [facility]. Please contact [facility] at [phone] with questions or concerns regarding your invoice.  ° °IF you received labwork today, you will receive an invoice from [facility]. Please contact [facility] at [phone] with questions or concerns regarding your invoice.  ° °Our billing staff will not be able to assist you with questions regarding bills from these companies. ° °You will be contacted with the lab results as soon as they are available. The fastest way to get your results is to activate your My Chart account. Instructions are located on the last page of this paperwork. If you have not heard from us regarding the results in 2 weeks, please contact this office. °  ° ° ° °

## 2019-06-09 NOTE — Progress Notes (Signed)
Established Patient Office Visit  Subjective:  Patient ID: Julia Garcia, female    DOB: [birth year]  Age: 48 y.o. MRN: [id number]  CC:  Chief Complaint  Patient presents with  . Cervical Spine Pain    follow-up from last OV pt states she has her appointment next week.   . Medication Management    medication has been helping with muscle pain and sleep     HPI Julia Garcia presents for follow up visit  Reports improvement since last visit. Her pain is lessening, her anxiety is starting to see some relief. Overall, she is feeling better. Optimistic to get started with PT next week, as she's had good luck with this in the past.   Otherwise, she has finished her bachelor's degree and is very excited and relieved by this.   Past Medical History:  Diagnosis Date  . Allergy   . Anxiety   . Arthritis   . Migraine     Past Surgical History:  Procedure Laterality Date  . CESAREAN SECTION      Family History  Problem Relation Age of Onset  . Cancer Mother        Lung and throat  . Cancer Father        Prostate  . Cancer Maternal Grandfather     Social History   Socioeconomic History  . Marital status: Married    Spouse name: Not on file  . Number of children: Not on file  . Years of education: Not on file  . Highest education level: Not on file  Occupational History  . Not on file  Social Needs  . Financial resource strain: Not on file  . Food insecurity    Worry: Not on file    Inability: Not on file  . Transportation needs    Medical: Not on file    Non-medical: Not on file  Tobacco Use  . Smoking status: Current Every Day Smoker    Packs/day: 0.80    Years: 20.00    Pack years: 16.00    Types: Cigarettes  . Smokeless tobacco: Never Used  Substance and Sexual Activity  . Alcohol use: No  . Drug use: No  . Sexual activity: Yes    Partners: Male  Lifestyle  . Physical activity    Days per week: Not on file    Minutes per session: Not on file  .  Stress: Not on file  Relationships  . Social Herbalist on phone: Not on file    Gets together: Not on file    Attends religious service: Not on file    Active member of club or organization: Not on file    Attends meetings of clubs or organizations: Not on file    Relationship status: Not on file  . Intimate partner violence    Fear of current or ex partner: Not on file    Emotionally abused: Not on file    Physically abused: Not on file    Forced sexual activity: Not on file  Other Topics Concern  . Not on file  Social History Narrative  . Not on file    Outpatient Medications Prior to Visit  Medication Sig Dispense Refill  . cyclobenzaprine (FLEXERIL) 10 MG tablet Take 1 tablet (10 mg total) by mouth 3 (three) times daily as needed for muscle spasms. 30 tablet 1  . traZODone (DESYREL) 50 MG tablet Take 0.5-1 tablets (25-50 mg total) by mouth at  bedtime as needed for sleep. 30 tablet 0   No facility-administered medications prior to visit.     Allergies  Allergen Reactions  . Latex Swelling  . Zithromax [Azithromycin] Swelling    Gets blisters filled with fluid    ROS Review of Systems  Constitutional: Negative.   HENT: Negative.   Eyes: Negative.   Respiratory: Negative.   Cardiovascular: Negative.   Gastrointestinal: Negative.   Endocrine: Negative.   Genitourinary: Negative.   Musculoskeletal: Positive for arthralgias, neck pain and neck stiffness.  Skin: Negative.   Allergic/Immunologic: Negative.   Hematological: Negative.   Psychiatric/Behavioral: The patient is nervous/anxious.   All other systems reviewed and are negative.     Objective:    Physical Exam  Constitutional: She is oriented to person, place, and time. She appears well-developed and well-nourished. No distress.  Cardiovascular: Normal rate and regular rhythm.  Pulmonary/Chest: Effort normal. No respiratory distress.  Neurological: She is alert and oriented to person, place,  and time.  Skin: Skin is warm and dry. No rash noted. She is not diaphoretic. No erythema. No pallor.  Psychiatric: She has a normal mood and affect. Her behavior is normal. Judgment and thought content normal.  Nursing note and vitals reviewed.   BP 126/79   Pulse 83   Temp 98.8 F (37.1 C) (Oral)   Resp 16   Ht 5' 1.42" (1.56 m)   Wt 195 lb (88.5 kg)   LMP 05/14/2019   SpO2 98%   BMI 36.35 kg/m  Wt Readings from Last 3 Encounters:  06/07/19 195 lb (88.5 kg)  05/24/19 196 lb (88.9 kg)  08/05/18 222 lb (100.7 kg)     Health Maintenance Due  Topic Date Due  . HIV Screening  12/19/1985  . TETANUS/TDAP  12/19/1989  . PAP SMEAR-Modifier  12/20/1991    There are no preventive care reminders to display for this patient.  Lab Results  Component Value Date   TSH 2.010 02/09/2018   Lab Results  Component Value Date   WBC 8.1 02/09/2018   HGB 12.5 02/09/2018   HCT 39.2 02/09/2018   MCV 88 02/09/2018   PLT 257 02/09/2018   Lab Results  Component Value Date   NA 138 02/09/2018   K 4.2 02/09/2018   CO2 19 (L) 02/09/2018   GLUCOSE 83 02/09/2018   BUN 8 02/09/2018   CREATININE 0.60 02/09/2018   BILITOT 0.2 02/09/2018   ALKPHOS 75 02/09/2018   AST 13 02/09/2018   ALT 11 02/09/2018   PROT 6.6 02/09/2018   ALBUMIN 4.0 02/09/2018   CALCIUM 8.4 (L) 02/09/2018   Lab Results  Component Value Date   CHOL 184 02/09/2018   Lab Results  Component Value Date   HDL 42 02/09/2018   Lab Results  Component Value Date   LDLCALC 114 (H) 02/09/2018   Lab Results  Component Value Date   TRIG 139 02/09/2018   Lab Results  Component Value Date   CHOLHDL 4.4 02/09/2018   No results found for: HGBA1C    Assessment & Plan:   Problem List Items Addressed This Visit    None      No orders of the defined types were placed in this encounter.   Follow-up: No follow-ups on file.   PLAN  Attend PT and follow up as needed  Encouraged TOC visit and CPE  Patient  encouraged to call clinic with any questions, comments, or concerns.   Maximiano Coss, NP

## 2019-06-13 ENCOUNTER — Ambulatory Visit: Payer: 59 | Attending: Registered Nurse

## 2019-06-13 ENCOUNTER — Other Ambulatory Visit: Payer: Self-pay

## 2019-06-13 DIAGNOSIS — R293 Abnormal posture: Secondary | ICD-10-CM | POA: Insufficient documentation

## 2019-06-13 DIAGNOSIS — M542 Cervicalgia: Secondary | ICD-10-CM

## 2019-06-13 DIAGNOSIS — R252 Cramp and spasm: Secondary | ICD-10-CM | POA: Insufficient documentation

## 2019-06-13 NOTE — Patient Instructions (Signed)
Cervical retraction 2-3 reps with scapula retraction every hour  As able
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
## 2019-06-21 ENCOUNTER — Ambulatory Visit: Payer: 59 | Attending: Registered Nurse | Admitting: Physical Therapy

## 2019-06-21 ENCOUNTER — Other Ambulatory Visit: Payer: Self-pay

## 2019-06-21 ENCOUNTER — Encounter: Payer: Self-pay | Admitting: Physical Therapy

## 2019-06-21 DIAGNOSIS — M542 Cervicalgia: Secondary | ICD-10-CM | POA: Diagnosis present

## 2019-06-21 DIAGNOSIS — R252 Cramp and spasm: Secondary | ICD-10-CM | POA: Diagnosis present

## 2019-06-21 DIAGNOSIS — R293 Abnormal posture: Secondary | ICD-10-CM | POA: Insufficient documentation
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
## 2019-06-23 ENCOUNTER — Ambulatory Visit: Payer: 59 | Admitting: Physical Therapy

## 2019-06-23 ENCOUNTER — Other Ambulatory Visit: Payer: Self-pay

## 2019-06-23 ENCOUNTER — Encounter: Payer: Self-pay | Admitting: Physical Therapy

## 2019-06-23 DIAGNOSIS — M542 Cervicalgia: Secondary | ICD-10-CM

## 2019-06-23 DIAGNOSIS — R293 Abnormal posture: Secondary | ICD-10-CM

## 2019-06-23 DIAGNOSIS — R252 Cramp and spasm: Secondary | ICD-10-CM

## 2019-06-23 NOTE — Patient Instructions (Signed)
Pt issued with clinic handout: Cervical Stab level 2: Exercise #2 Neck retraction with horizontal abduction 10 reps x 2 sets, 2 x per day   And via [employer]:
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
## 2019-06-27 ENCOUNTER — Other Ambulatory Visit: Payer: Self-pay

## 2019-06-27 ENCOUNTER — Ambulatory Visit: Payer: 59

## 2019-06-27 DIAGNOSIS — R293 Abnormal posture: Secondary | ICD-10-CM

## 2019-06-27 DIAGNOSIS — M542 Cervicalgia: Secondary | ICD-10-CM | POA: Diagnosis not present

## 2019-06-27 DIAGNOSIS — R252 Cramp and spasm: Secondary | ICD-10-CM
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
## 2019-06-29 ENCOUNTER — Other Ambulatory Visit: Payer: Self-pay

## 2019-06-29 ENCOUNTER — Ambulatory Visit: Payer: 59

## 2019-06-29 DIAGNOSIS — R252 Cramp and spasm: Secondary | ICD-10-CM

## 2019-06-29 DIAGNOSIS — M542 Cervicalgia: Secondary | ICD-10-CM
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
## 2019-07-03 ENCOUNTER — Ambulatory Visit: Payer: 59 | Admitting: Physical Therapy

## 2019-07-03 ENCOUNTER — Encounter: Payer: Self-pay | Admitting: Physical Therapy

## 2019-07-03 ENCOUNTER — Other Ambulatory Visit: Payer: Self-pay

## 2019-07-03 DIAGNOSIS — M542 Cervicalgia: Secondary | ICD-10-CM

## 2019-07-03 DIAGNOSIS — R252 Cramp and spasm: Secondary | ICD-10-CM

## 2019-07-03 DIAGNOSIS — R293 Abnormal posture: Secondary | ICD-10-CM
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
## 2019-07-03 NOTE — Patient Instructions (Signed)
Access Code: [email or web address]  URL: [email or web address]  Date: 07/03/2019  Prepared by: Hilda Blades   Exercises Sidelying Thoracic Rotation with Open Book - 10 reps - 3-5 seconds hold - 3-5x weekly Single Arm Doorway Pec Stretch at 90 Degrees Abduction - 10 reps - 10-15 seconds hold - 3-5x weekly Step Back Shoulder Stretch with Chair - 10 reps - 10-15 seconds hold - 3-5x weekly Standing Shoulder Horizontal Abduction with Resistance - 10 reps - 3-5x weekly

## 2019-07-06 ENCOUNTER — Other Ambulatory Visit: Payer: Self-pay

## 2019-07-06 ENCOUNTER — Ambulatory Visit: Payer: 59 | Admitting: Physical Therapy

## 2019-07-06 ENCOUNTER — Encounter: Payer: Self-pay | Admitting: Physical Therapy

## 2019-07-06 DIAGNOSIS — M542 Cervicalgia: Secondary | ICD-10-CM | POA: Diagnosis not present

## 2019-07-06 DIAGNOSIS — R293 Abnormal posture: Secondary | ICD-10-CM
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
## 2019-07-11 ENCOUNTER — Other Ambulatory Visit: Payer: Self-pay

## 2019-07-11 ENCOUNTER — Ambulatory Visit: Payer: 59 | Admitting: Physical Therapy

## 2019-07-11 ENCOUNTER — Encounter: Payer: Self-pay | Admitting: Physical Therapy

## 2019-07-11 DIAGNOSIS — M542 Cervicalgia: Secondary | ICD-10-CM

## 2019-07-11 DIAGNOSIS — R252 Cramp and spasm: Secondary | ICD-10-CM

## 2019-07-11 DIAGNOSIS — R293 Abnormal posture: Secondary | ICD-10-CM
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
## 2019-07-13 ENCOUNTER — Ambulatory Visit: Payer: 59

## 2019-11-30 ENCOUNTER — Telehealth: Payer: Self-pay | Admitting: Registered Nurse

## 2019-11-30 NOTE — Telephone Encounter (Signed)
Patient is requesting  What is the name of the medication? cyclobenzaprine (FLEXERIL) 10 MG tablet [id number]   Have you contacted your pharmacy to request a refill? y  Which pharmacy would you like this sent to? [facility] [place], [address] AT [address]  [place], [place] [address]  Phone:  [phone] Fax:  [phone]  DEA #:  [id number]   Patient notified that their request is being sent to the clinical staff for review and that they should receive a call once it is complete. If they do not receive a call within 72 hours they can check with their pharmacy or our office.

## 2019-11-30 NOTE — Telephone Encounter (Signed)
Please Advise

## 2019-12-01 ENCOUNTER — Other Ambulatory Visit: Payer: Self-pay | Admitting: Registered Nurse

## 2019-12-01 DIAGNOSIS — M5410 Radiculopathy, site unspecified: Secondary | ICD-10-CM

## 2019-12-01 MED ORDER — CYCLOBENZAPRINE HCL 10 MG PO TABS: 10.0000 mg | ORAL_TABLET | Freq: Three times a day (TID) | ORAL | 1 refills | Status: DC | PRN

## 2019-12-18 ENCOUNTER — Encounter: Payer: Self-pay | Admitting: Registered Nurse

## 2019-12-18 ENCOUNTER — Ambulatory Visit: Payer: 59 | Admitting: Registered Nurse

## 2019-12-18 ENCOUNTER — Other Ambulatory Visit: Payer: Self-pay

## 2019-12-18 VITALS — BP 108/65 | HR 84 | Temp 97.9°F | Resp 16 | Ht 61.0 in | Wt 199.0 lb

## 2019-12-18 DIAGNOSIS — Z1329 Encounter for screening for other suspected endocrine disorder: Secondary | ICD-10-CM | POA: Diagnosis not present

## 2019-12-18 DIAGNOSIS — Z7689 Persons encountering health services in other specified circumstances: Secondary | ICD-10-CM

## 2019-12-18 DIAGNOSIS — Z13228 Encounter for screening for other metabolic disorders: Secondary | ICD-10-CM

## 2019-12-18 DIAGNOSIS — Z789 Other specified health status: Secondary | ICD-10-CM | POA: Diagnosis not present

## 2019-12-18 DIAGNOSIS — Z13220 Encounter for screening for lipoid disorders: Secondary | ICD-10-CM | POA: Diagnosis not present

## 2019-12-18 DIAGNOSIS — Z130 Encounter for screening for diseases of the blood and blood-forming organs and certain disorders involving the immune mechanism: Secondary | ICD-10-CM

## 2019-12-18 MED ORDER — ULIPRISTAL ACETATE 30 MG PO TABS: 1.0000 | ORAL_TABLET | Freq: Once | ORAL | 0 refills | Status: AC

## 2019-12-18 NOTE — Patient Instructions (Signed)
° ° ° °  If you have lab work done today you will be contacted with your lab results within the next 2 weeks.  If you have not heard from us then please contact us. The fastest way to get your results is to register for My Chart. ° ° °IF you received an x-ray today, you will receive an invoice from [facility]. Please contact [facility] at [phone] with questions or concerns regarding your invoice.  ° °IF you received labwork today, you will receive an invoice from [facility]. Please contact [facility] at [phone] with questions or concerns regarding your invoice.  ° °Our billing staff will not be able to assist you with questions regarding bills from these companies. ° °You will be contacted with the lab results as soon as they are available. The fastest way to get your results is to activate your My Chart account. Instructions are located on the last page of this paperwork. If you have not heard from us regarding the results in 2 weeks, please contact this office. °  ° ° ° °

## 2019-12-18 NOTE — Progress Notes (Signed)
Established Patient Office Visit  Subjective:  Patient ID: Julia Garcia, female    DOB: 06-Nov-1970  Age: 49 y.o. MRN: [id number]  CC:  Chief Complaint  Patient presents with  . Transitions Of Care    Establish care. patient wants to get her labs done.     HPI Julia Garcia presents for visit to establish care and labs  Requesting full labs. Has been over a year. Last labs showed no concerns.  Also notes that while having intercourse with her husband, the condom broke. Feels that she would be unfit to carry a child given her age and tobacco use. Requesting EC.  No further concerns. Feeling well at this time.  Past Medical History:  Diagnosis Date  . Allergy   . Anxiety   . Arthritis   . Migraine     Past Surgical History:  Procedure Laterality Date  . CESAREAN SECTION  2003    Family History  Problem Relation Age of Onset  . Cancer Mother        Lung and throat  . Cancer Father        Prostate  . Cancer Maternal Grandfather     Social History   Socioeconomic History  . Marital status: Married    Spouse name: Not on file  . Number of children: 1  . Years of education: Not on file  . Highest education level: Not on file  Occupational History  . Not on file  Tobacco Use  . Smoking status: Current Every Day Smoker    Packs/day: 0.80    Years: 20.00    Pack years: 16.00    Types: Cigarettes  . Smokeless tobacco: Never Used  Substance and Sexual Activity  . Alcohol use: No  . Drug use: No    Comment: cbd/thc gummies  . Sexual activity: Yes    Partners: Male  Other Topics Concern  . Not on file  Social History Narrative  . Not on file   Social Determinants of Health   Financial Resource Strain:   . Difficulty of Paying Living Expenses:   Food Insecurity:   . Worried About Charity fundraiser in the Last Year:   . Arboriculturist in the Last Year:   Transportation Needs:   . Film/video editor (Medical):   Marland Kitchen Lack of Transportation  (Non-Medical):   Physical Activity:   . Days of Exercise per Week:   . Minutes of Exercise per Session:   Stress:   . Feeling of Stress :   Social Connections:   . Frequency of Communication with Friends and Family:   . Frequency of Social Gatherings with Friends and Family:   . Attends Religious Services:   . Active Member of Clubs or Organizations:   . Attends Archivist Meetings:   Marland Kitchen Marital Status:   Intimate Partner Violence:   . Fear of Current or Ex-Partner:   . Emotionally Abused:   Marland Kitchen Physically Abused:   . Sexually Abused:     Outpatient Medications Prior to Visit  Medication Sig Dispense Refill  . cyclobenzaprine (FLEXERIL) 10 MG tablet Take 1 tablet (10 mg total) by mouth 3 (three) times daily as needed for muscle spasms. 30 tablet 1  . traZODone (DESYREL) 50 MG tablet Take 0.5-1 tablets (25-50 mg total) by mouth at bedtime as needed for sleep. 30 tablet 0   No facility-administered medications prior to visit.    Allergies  Allergen Reactions  .  Latex Swelling  . Zithromax [Azithromycin] Swelling    Gets blisters filled with fluid    ROS Review of Systems  Constitutional: Negative.   HENT: Negative.   Eyes: Negative.   Respiratory: Negative.   Cardiovascular: Negative.   Gastrointestinal: Negative.   Endocrine: Negative.   Genitourinary: Negative.   Musculoskeletal: Negative.   Skin: Negative.   Allergic/Immunologic: Negative.   Neurological: Negative.   Hematological: Negative.   Psychiatric/Behavioral: Negative.   All other systems reviewed and are negative.     Objective:    Physical Exam  BP 108/65   Pulse 84   Temp 97.9 F (36.6 C) (Temporal)   Resp 16   Ht 5\' 1"  (1.549 m)   Wt 199 lb (90.3 kg)   LMP 12/04/2019   SpO2 98%   BMI 37.60 kg/m  Wt Readings from Last 3 Encounters:  12/18/19 199 lb (90.3 kg)  06/07/19 195 lb (88.5 kg)  05/24/19 196 lb (88.9 kg)     There are no preventive care reminders to display for  this patient.  There are no preventive care reminders to display for this patient.  Lab Results  Component Value Date   TSH 2.010 02/09/2018   Lab Results  Component Value Date   WBC 8.1 02/09/2018   HGB 12.5 02/09/2018   HCT 39.2 02/09/2018   MCV 88 02/09/2018   PLT 257 02/09/2018   Lab Results  Component Value Date   NA 138 02/09/2018   K 4.2 02/09/2018   CO2 19 (L) 02/09/2018   GLUCOSE 83 02/09/2018   BUN 8 02/09/2018   CREATININE 0.60 02/09/2018   BILITOT 0.2 02/09/2018   ALKPHOS 75 02/09/2018   AST 13 02/09/2018   ALT 11 02/09/2018   PROT 6.6 02/09/2018   ALBUMIN 4.0 02/09/2018   CALCIUM 8.4 (L) 02/09/2018   Lab Results  Component Value Date   CHOL 184 02/09/2018   Lab Results  Component Value Date   HDL 42 02/09/2018   Lab Results  Component Value Date   LDLCALC 114 (H) 02/09/2018   Lab Results  Component Value Date   TRIG 139 02/09/2018   Lab Results  Component Value Date   CHOLHDL 4.4 02/09/2018   No results found for: HGBA1C    Assessment & Plan:   Problem List Items Addressed This Visit    None    Visit Diagnoses    Problem with condom    -  Primary   Relevant Medications   ulipristal acetate (ELLA) 30 MG tablet   Screening for endocrine, metabolic and immunity disorder       Relevant Orders   TSH   Hemoglobin A1c   CBC with Differential   Comprehensive metabolic panel   Lipid screening       Relevant Orders   Lipid panel   Encounter to establish care          Meds ordered this encounter  Medications  . ulipristal acetate (ELLA) 30 MG tablet    Sig: Take 1 tablet (30 mg total) by mouth once for 1 dose.    Dispense:  1 tablet    Refill:  0    Order Specific Question:   Supervising Provider    Answer:   Forrest Moron [id number]    Follow-up: No follow-ups on file.   PLAN  Uliprital acetate sent to pharmacy. Take once with food and drink. Urine preg test in 2-3 weeks if desired or with abnormal menses. Discussed with  patient that as she is perimenopausal, 49 yo and a smoker, chances of conceiving are likely low, but EC is safest bet in thsi situation.  Labs collected, will follow up as warranted  Patient encouraged to call clinic with any questions, comments, or concerns.  Maximiano Coss, NP

## 2019-12-19 LAB — LIPID PANEL
Chol/HDL Ratio: 4.2 ratio (ref 0.0–4.4)
Cholesterol, Total: 209 mg/dL — ABNORMAL HIGH (ref 100–199)
HDL: 50 mg/dL (ref >39)
LDL Chol Calc (NIH): 135 mg/dL — ABNORMAL HIGH (ref 0–99)
Triglycerides: 136 mg/dL (ref 0–149)
VLDL Cholesterol Cal: 24 mg/dL (ref 5–40)

## 2019-12-19 LAB — COMPREHENSIVE METABOLIC PANEL
ALT: 10 IU/L (ref 0–32)
AST: 14 IU/L (ref 0–40)
Albumin/Globulin Ratio: 1.3 (ref 1.2–2.2)
Albumin: 3.8 g/dL (ref 3.8–4.8)
Alkaline Phosphatase: 68 IU/L (ref 39–117)
BUN/Creatinine Ratio: 13 (ref 9–23)
BUN: 7 mg/dL (ref 6–24)
Bilirubin Total: <0.2 mg/dL (ref 0.0–1.2)
CO2: 21 mmol/L (ref 20–29)
Calcium: 8.7 mg/dL (ref 8.7–10.2)
Chloride: 105 mmol/L (ref 96–106)
Creatinine, Ser: 0.55 mg/dL — ABNORMAL LOW (ref 0.57–1.00)
GFR calc Af Amer: 128 mL/min/{1.73_m2} (ref >59)
GFR calc non Af Amer: 111 mL/min/{1.73_m2} (ref >59)
Globulin, Total: 2.9 g/dL (ref 1.5–4.5)
Glucose: 75 mg/dL (ref 65–99)
Potassium: 4.7 mmol/L (ref 3.5–5.2)
Sodium: 141 mmol/L (ref 134–144)
Total Protein: 6.7 g/dL (ref 6.0–8.5)

## 2019-12-19 LAB — CBC WITH DIFFERENTIAL/PLATELET
Basophils Absolute: 0 10*3/uL (ref 0.0–0.2)
Basos: 1 %
EOS (ABSOLUTE): 0.1 10*3/uL (ref 0.0–0.4)
Eos: 1 %
Hematocrit: 40.1 % (ref 34.0–46.6)
Hemoglobin: 13.1 g/dL (ref 11.1–15.9)
Immature Grans (Abs): 0 10*3/uL (ref 0.0–0.1)
Immature Granulocytes: 0 %
Lymphocytes Absolute: 2.2 10*3/uL (ref 0.7–3.1)
Lymphs: 27 %
MCH: 28.2 pg (ref 26.6–33.0)
MCHC: 32.7 g/dL (ref 31.5–35.7)
MCV: 86 fL (ref 79–97)
Monocytes Absolute: 0.5 10*3/uL (ref 0.1–0.9)
Monocytes: 6 %
Neutrophils Absolute: 5.3 10*3/uL (ref 1.4–7.0)
Neutrophils: 65 %
Platelets: 290 10*3/uL (ref 150–450)
RBC: 4.65 x10E6/uL (ref 3.77–5.28)
RDW: 14.2 % (ref 11.7–15.4)
WBC: 8.1 10*3/uL (ref 3.4–10.8)

## 2019-12-19 LAB — TSH: TSH: 1.89 u[IU]/mL (ref 0.450–4.500)

## 2019-12-19 LAB — HEMOGLOBIN A1C
Est. average glucose Bld gHb Est-mCnc: 111 mg/dL
Hgb A1c MFr Bld: 5.5 % (ref 4.8–5.6)

## 2019-12-27 ENCOUNTER — Encounter: Payer: Self-pay | Admitting: Registered Nurse

## 2019-12-27 NOTE — Progress Notes (Signed)
Labs not concerning Letter sent via mychart  Kathrin Ruddy, NP

## 2020-04-14 ENCOUNTER — Emergency Department (INDEPENDENT_AMBULATORY_CARE_PROVIDER_SITE_OTHER): Payer: 59

## 2020-04-14 ENCOUNTER — Emergency Department
Admission: EM | Admit: 2020-04-14 | Discharge: 2020-04-14 | Disposition: A | Discharge status: Home / Self Care | Payer: 59 | Source: Home / Self Care | Attending: Family Medicine | Admitting: Family Medicine

## 2020-04-14 ENCOUNTER — Other Ambulatory Visit: Payer: Self-pay

## 2020-04-14 DIAGNOSIS — S93601A Unspecified sprain of right foot, initial encounter: Secondary | ICD-10-CM

## 2020-04-14 DIAGNOSIS — W1849XA Other slipping, tripping and stumbling without falling, initial encounter: Secondary | ICD-10-CM | POA: Diagnosis not present

## 2020-04-14 MED ORDER — HYDROCODONE-ACETAMINOPHEN 5-325 MG PO TABS: ORAL_TABLET | ORAL | 0 refills | Status: DC

## 2020-04-14 NOTE — Discharge Instructions (Addendum)
Apply ice pack for 30 minutes every 1 to 2 hours today and tomorrow.  Elevate.  Use crutches for 5 to 7 days.  Wear Ace wrap until swelling decreases.  Begin range of motion and stretching exercises as tolerated.  May take Tylenol daytime as needed for pain.

## 2020-04-14 NOTE — ED Triage Notes (Signed)
Pt c/o RT foot pain since Friday night when she tripped over her dog. Says pain is localized on top of foot and radiates up shin. Pain 8/10 Tylenol prn.
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
## 2020-04-25 LAB — HM PAP SMEAR: HM Pap smear: NEGATIVE

## 2020-11-13 ENCOUNTER — Telehealth: Payer: No Typology Code available for payment source | Admitting: Registered Nurse

## 2020-11-13 ENCOUNTER — Encounter: Payer: Self-pay | Admitting: Registered Nurse

## 2020-11-13 ENCOUNTER — Other Ambulatory Visit: Payer: Self-pay

## 2020-11-13 VITALS — Ht 62.0 in | Wt 189.0 lb

## 2020-11-13 DIAGNOSIS — J0100 Acute maxillary sinusitis, unspecified: Secondary | ICD-10-CM | POA: Diagnosis not present

## 2020-11-13 MED ORDER — AMOXICILLIN-POT CLAVULANATE 875-125 MG PO TABS: 1.0000 | ORAL_TABLET | Freq: Two times a day (BID) | ORAL | 0 refills | Status: DC

## 2020-11-13 MED ORDER — AZELASTINE HCL 0.15 % NA SOLN: 1.0000 | Freq: Two times a day (BID) | NASAL | 2 refills | Status: DC | PRN

## 2020-11-13 NOTE — Patient Instructions (Signed)
° ° ° °  If you have lab work done today you will be contacted with your lab results within the next 2 weeks.  If you have not heard from us then please contact us. The fastest way to get your results is to register for My Chart. ° ° °IF you received an x-ray today, you will receive an invoice from [facility]. Please contact [facility] at [phone] with questions or concerns regarding your invoice.  ° °IF you received labwork today, you will receive an invoice from [facility]. Please contact [facility] at [phone] with questions or concerns regarding your invoice.  ° °Our billing staff will not be able to assist you with questions regarding bills from these companies. ° °You will be contacted with the lab results as soon as they are available. The fastest way to get your results is to activate your My Chart account. Instructions are located on the last page of this paperwork. If you have not heard from us regarding the results in 2 weeks, please contact this office. °  ° ° ° °

## 2021-02-18 ENCOUNTER — Encounter: Payer: Self-pay | Admitting: Registered Nurse

## 2021-02-18 ENCOUNTER — Ambulatory Visit: Payer: No Typology Code available for payment source | Admitting: Registered Nurse

## 2021-02-18 ENCOUNTER — Other Ambulatory Visit: Payer: Self-pay

## 2021-02-18 VITALS — BP 105/64 | HR 77 | Temp 98.3°F | Resp 18 | Ht 62.0 in | Wt 197.4 lb

## 2021-02-18 DIAGNOSIS — D229 Melanocytic nevi, unspecified: Secondary | ICD-10-CM | POA: Diagnosis not present

## 2021-02-18 DIAGNOSIS — N951 Menopausal and female climacteric states: Secondary | ICD-10-CM

## 2021-02-18 DIAGNOSIS — Z23 Encounter for immunization: Secondary | ICD-10-CM

## 2021-02-18 MED ORDER — PROGESTERONE 200 MG PO CAPS: 200.0000 mg | ORAL_CAPSULE | Freq: Every day | ORAL | 0 refills | Status: DC

## 2021-02-18 MED ORDER — ESTRADIOL 0.5 MG PO TABS: 0.5000 mg | ORAL_TABLET | Freq: Every day | ORAL | 0 refills | Status: DC

## 2021-02-18 NOTE — Patient Instructions (Addendum)
Ms. Julia Garcia to see you  Start Estrace 0.5mg  daily and Progesterone 200mg  daily. This can help with perimenopausal symptoms.  Shingles vaccine - first dose today, should just be sore arm. Next shot in 2-6 mo, this one may give you a couple of days of flu like symptoms.  Referral to dermatology - they should call in 5-6 weeks at the latest  Thank you, see you in a few weeks  Rich     If you have lab work done today you will be contacted with your lab results within the next 2 weeks.  If you have not heard from [place] then please contact us. The fastest way to get your results is to register for My Chart.   IF you received an x-ray today, you will receive an invoice from [facility]. Please contact [facility] at [phone] with questions or concerns regarding your invoice.   IF you received labwork today, you will receive an invoice from [facility]. Please contact [facility] at [phone] with questions or concerns regarding your invoice.   Our billing staff will not be able to assist you with questions regarding bills from these companies.  You will be contacted with the lab results as soon as they are available. The fastest way to get your results is to activate your My Chart account. Instructions are located on the last page of this paperwork. If you have not heard from [place] regarding the results in 2 weeks, please contact this office.

## 2021-02-18 NOTE — Progress Notes (Signed)
Established Patient Office Visit  Subjective:  Patient ID: Julia Garcia, female    DOB: 1971-08-21  Age: 50 y.o. MRN: [id number]  CC:  Chief Complaint  Patient presents with  . Follow-up    Patient states she would like to discuss a referral to the dermatology and also discuss premenopausal , and medication refill.     HPI Julia Garcia presents for follow up   Dermatology Multiple nevi None suspicious Would like skin survey Has been 20+ years since seeing derm  Menopause Symptoms: emotional lability, diffuse joint pain, 60+ days between menses with 2-3 weeks of bleeding at cycle Would be interested in any management of this No abnormal pains, no frequent bleeding  Shingles Interested in vaccination Has not been vaccinated Did have chickenpox as child  Past Medical History:  Diagnosis Date  . Allergy   . Anxiety   . Arthritis   . Migraine     Past Surgical History:  Procedure Laterality Date  . CESAREAN SECTION  2003    Family History  Problem Relation Age of Onset  . Cancer Mother        Lung and throat  . Cancer Father        Prostate  . Cancer Maternal Grandfather     Social History   Socioeconomic History  . Marital status: Married    Spouse name: Not on file  . Number of children: 1  . Years of education: Not on file  . Highest education level: Not on file  Occupational History  . Not on file  Tobacco Use  . Smoking status: Current Every Day Smoker    Packs/day: 0.80    Years: 20.00    Pack years: 16.00    Types: Cigarettes  . Smokeless tobacco: Never Used  Vaping Use  . Vaping Use: Former  . Substances: THC, CBD  Substance and Sexual Activity  . Alcohol use: No  . Drug use: No    Comment: cbd/thc gummies  . Sexual activity: Yes    Partners: Male  Other Topics Concern  . Not on file  Social History Narrative  . Not on file   Social Determinants of Health   Financial Resource Strain: Not on file  Food Insecurity: Not on  file  Transportation Needs: Not on file  Physical Activity: Not on file  Stress: Not on file  Social Connections: Not on file  Intimate Partner Violence: Not on file    Outpatient Medications Prior to Visit  Medication Sig Dispense Refill  . Azelastine HCl 0.15 % SOLN Place 1 spray into the nose 2 (two) times daily as needed. 30 mL 2  . cyclobenzaprine (FLEXERIL) 10 MG tablet Take 1 tablet (10 mg total) by mouth 3 (three) times daily as needed for muscle spasms. 30 tablet 1  . amoxicillin-clavulanate (AUGMENTIN) 875-125 MG tablet Take 1 tablet by mouth 2 (two) times daily. 20 tablet 0   No facility-administered medications prior to visit.    Allergies  Allergen Reactions  . Latex Swelling  . Other   . Zithromax [Azithromycin] Swelling    Gets blisters filled with fluid    ROS Review of Systems  Constitutional: Negative.   HENT: Negative.   Eyes: Negative.   Respiratory: Negative.   Cardiovascular: Negative.   Gastrointestinal: Negative.   Genitourinary: Negative.   Musculoskeletal: Negative.   Skin: Negative.   Neurological: Negative.   Psychiatric/Behavioral: Negative.   All other systems reviewed and are negative.  Objective:    Physical Exam Vitals and nursing note reviewed.  Constitutional:      General: She is not in acute distress.    Appearance: Normal appearance. She is normal weight. She is not ill-appearing, toxic-appearing or diaphoretic.  Cardiovascular:     Rate and Rhythm: Normal rate and regular rhythm.     Heart sounds: Normal heart sounds. No murmur heard. No friction rub. No gallop.   Pulmonary:     Effort: Pulmonary effort is normal. No respiratory distress.     Breath sounds: Normal breath sounds. No stridor. No wheezing, rhonchi or rales.  Chest:     Chest wall: No tenderness.  Skin:    General: Skin is warm and dry.     Findings: Lesion (multiple nevi) present.  Neurological:     General: No focal deficit present.     Mental  Status: She is alert and oriented to person, place, and time. Mental status is at baseline.  Psychiatric:        Mood and Affect: Mood normal.        Behavior: Behavior normal.        Thought Content: Thought content normal.        Judgment: Judgment normal.     BP 105/64   Pulse 77   Temp 98.3 F (36.8 C) (Temporal)   Resp 18   Ht 5\' 2"  (1.575 m)   Wt 197 lb 6.4 oz (89.5 kg)   SpO2 99%   BMI 36.10 kg/m  Wt Readings from Last 3 Encounters:  02/18/21 197 lb 6.4 oz (89.5 kg)  11/13/20 189 lb (85.7 kg)  12/18/19 199 lb (90.3 kg)     Health Maintenance Due  Topic Date Due  . Zoster Vaccines- Shingrix (1 of 2) Never done    There are no preventive care reminders to display for this patient.  Lab Results  Component Value Date   TSH 1.890 12/18/2019   Lab Results  Component Value Date   WBC 8.1 12/18/2019   HGB 13.1 12/18/2019   HCT 40.1 12/18/2019   MCV 86 12/18/2019   PLT 290 12/18/2019   Lab Results  Component Value Date   NA 141 12/18/2019   K 4.7 12/18/2019   CO2 21 12/18/2019   GLUCOSE 75 12/18/2019   BUN 7 12/18/2019   CREATININE 0.55 (L) 12/18/2019   BILITOT <0.2 12/18/2019   ALKPHOS 68 12/18/2019   AST 14 12/18/2019   ALT 10 12/18/2019   PROT 6.7 12/18/2019   ALBUMIN 3.8 12/18/2019   CALCIUM 8.7 12/18/2019   Lab Results  Component Value Date   CHOL 209 (H) 12/18/2019   Lab Results  Component Value Date   HDL 50 12/18/2019   Lab Results  Component Value Date   LDLCALC 135 (H) 12/18/2019   Lab Results  Component Value Date   TRIG 136 12/18/2019   Lab Results  Component Value Date   CHOLHDL 4.2 12/18/2019   Lab Results  Component Value Date   HGBA1C 5.5 12/18/2019      Assessment & Plan:   Problem List Items Addressed This Visit   None   Visit Diagnoses    Perimenopausal    -  Primary   Relevant Medications   progesterone (PROMETRIUM) 200 MG capsule   estradiol (ESTRACE) 0.5 MG tablet   Multiple nevi       Relevant  Orders   Ambulatory referral to Dermatology   Need for shingles vaccine  Relevant Orders   Varicella-zoster vaccine IM      Meds ordered this encounter  Medications  . progesterone (PROMETRIUM) 200 MG capsule    Sig: Take 1 capsule (200 mg total) by mouth daily.    Dispense:  90 capsule    Refill:  0    Order Specific Question:   Supervising Provider    Answer:   Carlota Raspberry, JEFFREY R [[id number]]  . estradiol (ESTRACE) 0.5 MG tablet    Sig: Take 1 tablet (0.5 mg total) by mouth daily.    Dispense:  90 tablet    Refill:  0    Order Specific Question:   Supervising Provider    Answer:   Carlota Raspberry, JEFFREY R [[id number]]    Follow-up: No follow-ups on file.   PLAN  Refer to derm  Start estrace and progresterone, med check in 4 weeks at CPE  Shingles vaccine given  Patient encouraged to call clinic with any questions, comments, or concerns.  Maximiano Coss, NP

## 2021-03-24 ENCOUNTER — Other Ambulatory Visit: Payer: Self-pay

## 2021-03-24 ENCOUNTER — Ambulatory Visit (INDEPENDENT_AMBULATORY_CARE_PROVIDER_SITE_OTHER): Payer: No Typology Code available for payment source | Admitting: Registered Nurse

## 2021-03-24 ENCOUNTER — Encounter: Payer: Self-pay | Admitting: Registered Nurse

## 2021-03-24 VITALS — BP 107/70 | HR 76 | Temp 98.3°F | Resp 18 | Ht 62.0 in | Wt 194.0 lb

## 2021-03-24 DIAGNOSIS — Z1329 Encounter for screening for other suspected endocrine disorder: Secondary | ICD-10-CM | POA: Diagnosis not present

## 2021-03-24 DIAGNOSIS — Z13220 Encounter for screening for lipoid disorders: Secondary | ICD-10-CM | POA: Diagnosis not present

## 2021-03-24 DIAGNOSIS — Z0000 Encounter for general adult medical examination without abnormal findings: Secondary | ICD-10-CM

## 2021-03-24 DIAGNOSIS — M5410 Radiculopathy, site unspecified: Secondary | ICD-10-CM

## 2021-03-24 DIAGNOSIS — Z13228 Encounter for screening for other metabolic disorders: Secondary | ICD-10-CM

## 2021-03-24 DIAGNOSIS — Z1231 Encounter for screening mammogram for malignant neoplasm of breast: Secondary | ICD-10-CM | POA: Diagnosis not present

## 2021-03-24 DIAGNOSIS — Z1159 Encounter for screening for other viral diseases: Secondary | ICD-10-CM

## 2021-03-24 DIAGNOSIS — Z1211 Encounter for screening for malignant neoplasm of colon: Secondary | ICD-10-CM

## 2021-03-24 DIAGNOSIS — Z130 Encounter for screening for diseases of the blood and blood-forming organs and certain disorders involving the immune mechanism: Secondary | ICD-10-CM

## 2021-03-24 MED ORDER — CYCLOBENZAPRINE HCL 10 MG PO TABS: 10.0000 mg | ORAL_TABLET | Freq: Three times a day (TID) | ORAL | 1 refills | Status: DC | PRN

## 2021-03-24 NOTE — Progress Notes (Signed)
Established Patient Office Visit  Subjective:  Patient ID: Julia Garcia, female    DOB: [birth year]  Age: 50 y.o. MRN: [id number]  CC:  Chief Complaint  Patient presents with   Annual Exam    Patient states she is here for her annual CPE.    HPI Julia Garcia presents for CPE  No acute concerns  Histories reviewed and updated with patient.   Recent derm visit - 2 moles removed, biopsy not back yet   Progestoerone - 200mg  - emotions more stable, breasts tender, and some cramping in pelvis. Benefits outweigh risks  Med refill : cyclobenzaprine - last rx of 30 tabs lasted around 1 year. Takes for occ muscle aches. Tolerates well, good effect, no Aes. Would like refill  Due for colon cancer screening. No fam hx or concerning symptoms. Would prefer cologuard.  Up to date on mammo and pap - get these done in [place] at [place] [facility] - will have record sent.  Past Medical History:  Diagnosis Date   Allergy    Anxiety    Arthritis    Migraine     Past Surgical History:  Procedure Laterality Date   CESAREAN SECTION  2003    Family History  Problem Relation Age of Onset   Cancer Mother        Lung and throat   Cancer Father        Prostate   Cancer Maternal Grandfather     Social History   Socioeconomic History   Marital status: Married    Spouse name: Not on file   Number of children: 1   Years of education: Not on file   Highest education level: Not on file  Occupational History   Not on file  Tobacco Use   Smoking status: Every Day    Packs/day: 0.80    Years: 20.00    Pack years: 16.00    Types: Cigarettes   Smokeless tobacco: Never  Vaping Use   Vaping Use: Former   Substances: THC, CBD  Substance and Sexual Activity   Alcohol use: No   Drug use: No    Comment: cbd/thc gummies   Sexual activity: Yes    Partners: Male  Other Topics Concern   Not on file  Social History Narrative   Not on file   Social Determinants of Health   Financial  Resource Strain: Not on file  Food Insecurity: Not on file  Transportation Needs: Not on file  Physical Activity: Not on file  Stress: Not on file  Social Connections: Not on file  Intimate Partner Violence: Not on file    Outpatient Medications Prior to Visit  Medication Sig Dispense Refill   Azelastine HCl 0.15 % SOLN Place 1 spray into the nose 2 (two) times daily as needed. 30 mL 2   cyclobenzaprine (FLEXERIL) 10 MG tablet Take 1 tablet (10 mg total) by mouth 3 (three) times daily as needed for muscle spasms. 30 tablet 1   estradiol (ESTRACE) 0.5 MG tablet Take 1 tablet (0.5 mg total) by mouth daily. 90 tablet 0   progesterone (PROMETRIUM) 200 MG capsule Take 1 capsule (200 mg total) by mouth daily. 90 capsule 0   No facility-administered medications prior to visit.    Allergies  Allergen Reactions   Latex Swelling   Other    Zithromax [Azithromycin] Swelling    Gets blisters filled with fluid    ROS Review of Systems  Constitutional: Negative.   HENT:  Negative.    Eyes: Negative.   Respiratory: Negative.    Cardiovascular: Negative.   Gastrointestinal: Negative.   Genitourinary: Negative.   Musculoskeletal: Negative.   Skin: Negative.   Neurological: Negative.   Psychiatric/Behavioral: Negative.    All other systems reviewed and are negative.    Objective:    Physical Exam Vitals and nursing note reviewed.  Constitutional:      General: She is not in acute distress.    Appearance: Normal appearance. She is normal weight. She is not ill-appearing, toxic-appearing or diaphoretic.  HENT:     Head: Normocephalic and atraumatic.     Right Ear: Tympanic membrane, ear canal and external ear normal. There is no impacted cerumen.     Left Ear: Tympanic membrane, ear canal and external ear normal. There is no impacted cerumen.     Nose: Nose normal. No congestion or rhinorrhea.     Mouth/Throat:     Mouth: Mucous membranes are moist.     Pharynx: Oropharynx is  clear. No oropharyngeal exudate or posterior oropharyngeal erythema.  Eyes:     General: No scleral icterus.       Right eye: No discharge.        Left eye: No discharge.     Extraocular Movements: Extraocular movements intact.     Conjunctiva/sclera: Conjunctivae normal.     Pupils: Pupils are equal, round, and reactive to light.  Cardiovascular:     Rate and Rhythm: Normal rate and regular rhythm.     Pulses: Normal pulses.     Heart sounds: Normal heart sounds. No murmur heard.   No friction rub. No gallop.  Pulmonary:     Effort: Pulmonary effort is normal. No respiratory distress.     Breath sounds: Normal breath sounds. No stridor. No wheezing, rhonchi or rales.  Chest:     Chest wall: No tenderness.  Abdominal:     General: Abdomen is flat. Bowel sounds are normal. There is no distension.     Palpations: Abdomen is soft. There is no mass.     Tenderness: There is no abdominal tenderness. There is no right CVA tenderness, left CVA tenderness, guarding or rebound.     Hernia: No hernia is present.  Musculoskeletal:        General: No swelling, tenderness, deformity or signs of injury. Normal range of motion.     Right lower leg: No edema.     Left lower leg: No edema.  Skin:    General: Skin is warm and dry.     Capillary Refill: Capillary refill takes less than 2 seconds.     Coloration: Skin is not jaundiced or pale.     Findings: No bruising, erythema, lesion or rash.  Neurological:     General: No focal deficit present.     Mental Status: She is alert and oriented to person, place, and time. Mental status is at baseline.     Cranial Nerves: No cranial nerve deficit.     Sensory: No sensory deficit.     Motor: No weakness.     Coordination: Coordination normal.     Gait: Gait normal.     Deep Tendon Reflexes: Reflexes normal.  Psychiatric:        Mood and Affect: Mood normal.        Behavior: Behavior normal.        Thought Content: Thought content normal.         Judgment: Judgment normal.  BP 107/70   Pulse 76   Temp 98.3 F (36.8 C) (Temporal)   Resp 18   Ht 5\' 2"  (1.575 m)   Wt 194 lb (88 kg)   SpO2 99%   BMI 35.48 kg/m  Wt Readings from Last 3 Encounters:  03/24/21 194 lb (88 kg)  02/18/21 197 lb 6.4 oz (89.5 kg)  11/13/20 189 lb (85.7 kg)     There are no preventive care reminders to display for this patient.  There are no preventive care reminders to display for this patient.  Lab Results  Component Value Date   TSH 1.890 12/18/2019   Lab Results  Component Value Date   WBC 8.1 12/18/2019   HGB 13.1 12/18/2019   HCT 40.1 12/18/2019   MCV 86 12/18/2019   PLT 290 12/18/2019   Lab Results  Component Value Date   NA 141 12/18/2019   K 4.7 12/18/2019   CO2 21 12/18/2019   GLUCOSE 75 12/18/2019   BUN 7 12/18/2019   CREATININE 0.55 (L) 12/18/2019   BILITOT <0.2 12/18/2019   ALKPHOS 68 12/18/2019   AST 14 12/18/2019   ALT 10 12/18/2019   PROT 6.7 12/18/2019   ALBUMIN 3.8 12/18/2019   CALCIUM 8.7 12/18/2019   Lab Results  Component Value Date   CHOL 209 (H) 12/18/2019   Lab Results  Component Value Date   HDL 50 12/18/2019   Lab Results  Component Value Date   LDLCALC 135 (H) 12/18/2019   Lab Results  Component Value Date   TRIG 136 12/18/2019   Lab Results  Component Value Date   CHOLHDL 4.2 12/18/2019   Lab Results  Component Value Date   HGBA1C 5.5 12/18/2019      Assessment & Plan:   Problem List Items Addressed This Visit   None Visit Diagnoses     Annual physical exam    -  Primary   Screening for endocrine, metabolic and immunity disorder       Relevant Orders   CBC with Differential/Platelet   Comprehensive metabolic panel   Hemoglobin A1c   TSH   Lipid screening       Relevant Orders   Lipid panel   Screening mammogram for breast cancer       Relevant Orders   MM Digital Screening   Colon cancer screening       Relevant Orders   Cologuard   Screening for viral disease        Relevant Orders   HIV antibody (with reflex)   Hepatitis C Antibody   Back pain with right-sided radiculopathy       Relevant Medications   cyclobenzaprine (FLEXERIL) 10 MG tablet       Meds ordered this encounter  Medications   cyclobenzaprine (FLEXERIL) 10 MG tablet    Sig: Take 1 tablet (10 mg total) by mouth 3 (three) times daily as needed for muscle spasms.    Dispense:  30 tablet    Refill:  1    Order Specific Question:   Supervising Provider    Answer:   Carlota Raspberry, JEFFREY R [[id number]]    Follow-up: Return in about 1 year (around 03/24/2022) for CPE and labs.   PLAN Refill cyclobenzaprine Labs collected. Will follow up with the patient as warranted. Exam unremarkable Cologuard order sent Patient encouraged to call clinic with any questions, comments, or concerns.  Maximiano Coss, NP

## 2021-03-24 NOTE — Patient Instructions (Addendum)
Great to see you   Glad the hormones are working well  Refill on flexeril sent  Labs will be back tomorrow  Prompt return on cologuard if possible - will keep you in the loop on results.  See you in a year for a physical, sooner with any concerns  Thank you  Julia Garcia     If you have lab work done today you will be contacted with your lab results within the next 2 weeks.  If you have not heard from [place] then please contact us. The fastest way to get your results is to register for My Chart.   IF you received an x-ray today, you will receive an invoice from [facility]. Please contact [facility] at [phone] with questions or concerns regarding your invoice.   IF you received labwork today, you will receive an invoice from [facility]. Please contact [facility] at [phone] with questions or concerns regarding your invoice.   Our billing staff will not be able to assist you with questions regarding bills from these companies.  You will be contacted with the lab results as soon as they are available. The fastest way to get your results is to activate your My Chart account. Instructions are located on the last page of this paperwork. If you have not heard from [place] regarding the results in 2 weeks, please contact this office.

## 2021-03-25 ENCOUNTER — Other Ambulatory Visit: Payer: Self-pay | Admitting: Registered Nurse

## 2021-03-25 DIAGNOSIS — E782 Mixed hyperlipidemia: Secondary | ICD-10-CM

## 2021-03-25 LAB — HIV ANTIBODY (ROUTINE TESTING W REFLEX): HIV 1&2 Ab, 4th Generation: NONREACTIVE

## 2021-03-25 LAB — HEPATITIS C ANTIBODY
Hepatitis C Ab: NONREACTIVE
SIGNAL TO CUT-OFF: 0.02 (ref <1.00)

## 2021-03-25 LAB — COMPREHENSIVE METABOLIC PANEL
ALT: 12 U/L (ref 0–35)
AST: 13 U/L (ref 0–37)
Albumin: 4.4 g/dL (ref 3.5–5.2)
Alkaline Phosphatase: 57 U/L (ref 39–117)
BUN: 5 mg/dL — ABNORMAL LOW (ref 6–23)
CO2: 22 mEq/L (ref 19–32)
Calcium: 8.7 mg/dL (ref 8.4–10.5)
Chloride: 106 mEq/L (ref 96–112)
Creatinine, Ser: 0.57 mg/dL (ref 0.40–1.20)
GFR: 106.08 mL/min (ref >60.00)
Glucose, Bld: 86 mg/dL (ref 70–99)
Potassium: 4 mEq/L (ref 3.5–5.1)
Sodium: 138 mEq/L (ref 135–145)
Total Bilirubin: 0.4 mg/dL (ref 0.2–1.2)
Total Protein: 6.8 g/dL (ref 6.0–8.3)

## 2021-03-25 LAB — LIPID PANEL
Cholesterol: 220 mg/dL — ABNORMAL HIGH (ref 0–200)
HDL: 50.1 mg/dL (ref >39.00)
LDL Cholesterol: 143 mg/dL — ABNORMAL HIGH (ref 0–99)
NonHDL: 169.51
Total CHOL/HDL Ratio: 4
Triglycerides: 135 mg/dL (ref 0.0–149.0)
VLDL: 27 mg/dL (ref 0.0–40.0)

## 2021-03-25 LAB — CBC WITH DIFFERENTIAL/PLATELET
Basophils Absolute: 0 10*3/uL (ref 0.0–0.1)
Basophils Relative: 0.5 % (ref 0.0–3.0)
Eosinophils Absolute: 0 10*3/uL (ref 0.0–0.7)
Eosinophils Relative: 0.5 % (ref 0.0–5.0)
HCT: 39.1 % (ref 36.0–46.0)
Hemoglobin: 13.2 g/dL (ref 12.0–15.0)
Lymphocytes Relative: 18 % (ref 12.0–46.0)
Lymphs Abs: 1.6 10*3/uL (ref 0.7–4.0)
MCHC: 33.9 g/dL (ref 30.0–36.0)
MCV: 86.7 fl (ref 78.0–100.0)
Monocytes Absolute: 0.4 10*3/uL (ref 0.1–1.0)
Monocytes Relative: 4.7 % (ref 3.0–12.0)
Neutro Abs: 7 10*3/uL (ref 1.4–7.7)
Neutrophils Relative %: 76.3 % (ref 43.0–77.0)
Platelets: 290 10*3/uL (ref 150.0–400.0)
RBC: 4.51 Mil/uL (ref 3.87–5.11)
RDW: 15.1 % (ref 11.5–15.5)
WBC: 9.1 10*3/uL (ref 4.0–10.5)

## 2021-03-25 LAB — TSH: TSH: 1.94 u[IU]/mL (ref 0.35–5.50)

## 2021-03-25 LAB — HEMOGLOBIN A1C: Hgb A1c MFr Bld: 5.6 % (ref 4.6–6.5)

## 2021-03-25 MED ORDER — ATORVASTATIN CALCIUM 20 MG PO TABS: 20.0000 mg | ORAL_TABLET | Freq: Every day | ORAL | 3 refills | Status: DC

## 2021-04-03 ENCOUNTER — Other Ambulatory Visit: Payer: Self-pay | Admitting: Registered Nurse

## 2021-04-03 DIAGNOSIS — Z1211 Encounter for screening for malignant neoplasm of colon: Secondary | ICD-10-CM

## 2021-05-05 ENCOUNTER — Ambulatory Visit (INDEPENDENT_AMBULATORY_CARE_PROVIDER_SITE_OTHER): Payer: No Typology Code available for payment source | Admitting: Registered Nurse

## 2021-05-05 ENCOUNTER — Other Ambulatory Visit: Payer: Self-pay

## 2021-05-05 DIAGNOSIS — Z23 Encounter for immunization: Secondary | ICD-10-CM | POA: Diagnosis not present

## 2021-05-05 NOTE — Progress Notes (Signed)
Patient here for second shingles vaccine, first was administered on 02/17/2021 pt states some hardness at area of injection and slight warmth but dissipated after a few days, advised gentle massage and cool compress to help with this if occurs again. Pt voiced understanding. Administered to Left arm no concerns

## 2021-05-12 LAB — COLOGUARD: Cologuard: NEGATIVE
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
## 2021-08-20 ENCOUNTER — Telehealth (INDEPENDENT_AMBULATORY_CARE_PROVIDER_SITE_OTHER): Payer: No Typology Code available for payment source | Admitting: Medical

## 2021-08-20 DIAGNOSIS — J0100 Acute maxillary sinusitis, unspecified: Secondary | ICD-10-CM | POA: Diagnosis not present

## 2021-08-20 DIAGNOSIS — H9209 Otalgia, unspecified ear: Secondary | ICD-10-CM

## 2021-08-20 MED ORDER — BENZONATATE 100 MG PO CAPS: 100.0000 mg | ORAL_CAPSULE | Freq: Three times a day (TID) | ORAL | 0 refills | Status: DC | PRN

## 2021-08-20 MED ORDER — AMOXICILLIN-POT CLAVULANATE 875-125 MG PO TABS: 1.0000 | ORAL_TABLET | Freq: Two times a day (BID) | ORAL | 0 refills | Status: DC

## 2021-08-20 MED ORDER — FLUTICASONE PROPIONATE 50 MCG/ACT NA SUSP
2.0000 | Freq: Every day | NASAL | 1 refills | Status: DC
Start: 2021-08-20 — End: 2023-04-02

## 2021-08-20 NOTE — Patient Instructions (Addendum)
You appear to have a sinus infection with possible ear infection. Prescribed  augmentin antibiotic for the infection. To help with the nasal congestion I prescribed nasal steroid. For your associated cough, I prescribed cough medicine benzonatate  Recommend doing covid test today and notify us if +. Can offer antiviral if +.  Rest, hydrate, tylenol for fever.  Follow up in 7 days or as needed.

## 2021-08-20 NOTE — Progress Notes (Signed)
   Subjective:    Patient ID: Julia Garcia, female    DOB: 1971/03/24, 50 y.o.   MRN: [id number]  HPI Virtual Visit via Video Note  I connected with Julia Garcia on 08/20/21 at  1:40 PM EST by a video enabled telemedicine application and verified that I am speaking with the correct person using two identifiers.  Location: Patient: home Provider: home   I discussed the limitations of evaluation and management by telemedicine and the availability of in person appointments. The patient expressed understanding and agreed to proceed.  History of Present Illness:   Pt has been sick since Saturday. Mild ear pain, nasal congestion, runny nose and sinus pressure.   Ear pressure getting worse.  Pt as not tested for covid. Pt had 3 covid vaccines.  Last covid vaccine in august.   Hx of sinus infection.  Observations/Objective:  General-no acute distress, pleasant, oriented. Lungs- on inspection lungs appear unlabored. Neck- no tracheal deviation or jvd on inspection. Neuro- gross motor function appears intact.  Heent- both frontal and maxillary sinus pressure.  Assessment and Plan: Patient Instructions  Your appear to have a sinus infection with possible ear infection. Prescribed  augmentin antibiotic for the infection. To help with the nasal congestion I prescribed nasal steroid. For your associated cough, I prescribed cough medicine benzonatate  Recommend doing covid test today and notify us if +. Can offer antiviral if +.  Rest, hydrate, tylenol for fever.  Follow up in 7 days or as needed.    Mackie Pai, PA-C   Follow Up Instructions:    I discussed the assessment and treatment plan with the patient. The patient was provided an opportunity to ask questions and all were answered. The patient agreed with the plan and demonstrated an understanding of the instructions.   The patient was advised to call back or seek an in-person evaluation if the symptoms worsen or if  the condition fails to improve as anticipated.  Time spent with patient today was  20 minutes which consisted of chart revdiew, discussing diagnosis, work up treatment and documentation.    Mackie Pai, PA-C    Review of Systems  Constitutional:  Negative for chills, fatigue and fever.  HENT:  Positive for congestion, ear pain, sinus pressure and sinus pain.   Respiratory:  Positive for cough. Negative for shortness of breath and wheezing.   Cardiovascular:  Negative for chest pain and palpitations.  Gastrointestinal:  Negative for abdominal pain.  Musculoskeletal:  Negative for back pain.      Objective:   Physical Exam        Assessment & Plan:

## 2021-08-21 ENCOUNTER — Encounter: Payer: Self-pay | Admitting: Medical

## 2022-02-27 IMAGING — DX DG FOOT COMPLETE 3+V*R*
3 series · 3 of 3 positions shown · non-contrast
Comparison: August 23, 2009

CLINICAL DATA: Tripped over dog, dorsal foot pain that radiates up
the leg with direct pressure, swelling and RIGHT foot reported

EXAM:
RIGHT FOOT COMPLETE - 3+ VIEW

[foot ap]
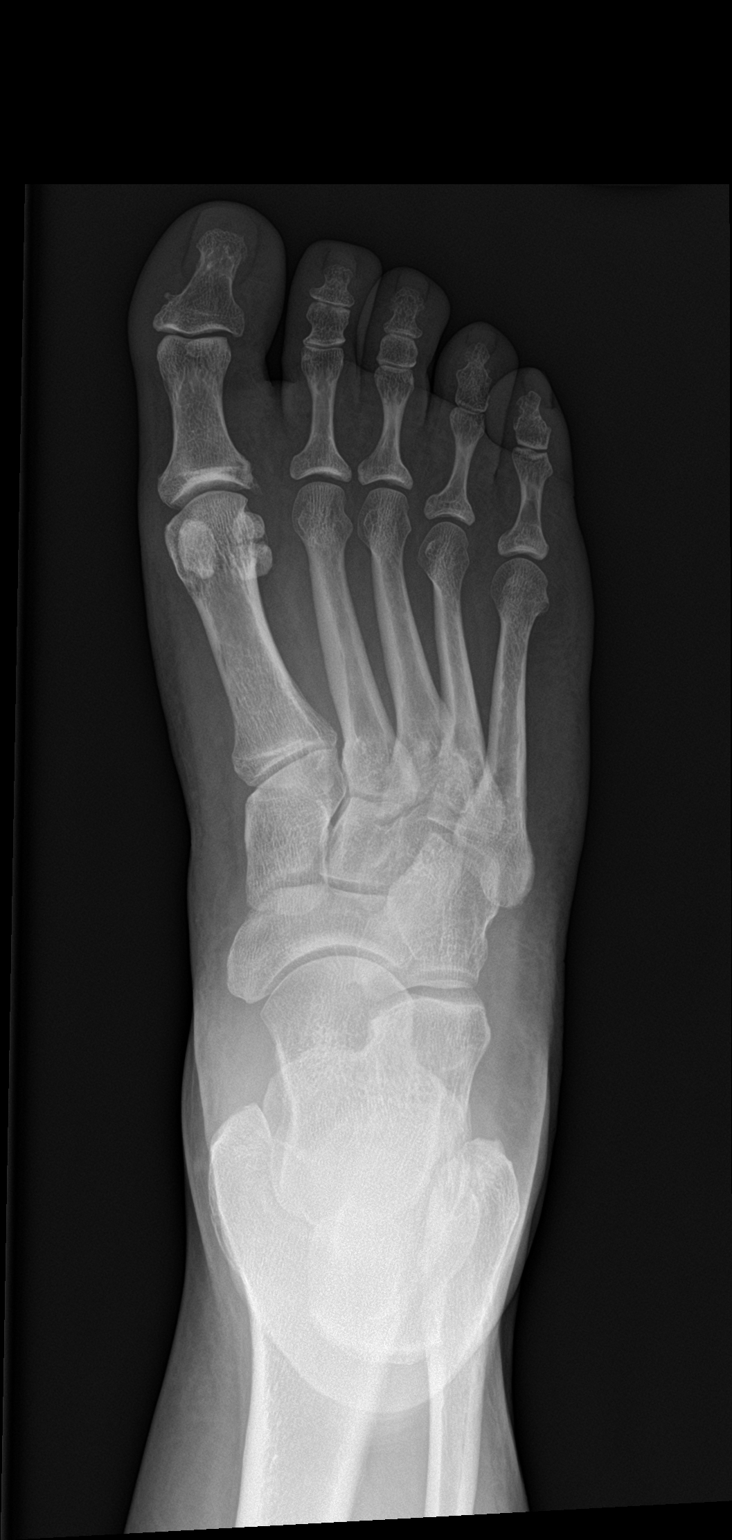

[foot obl]
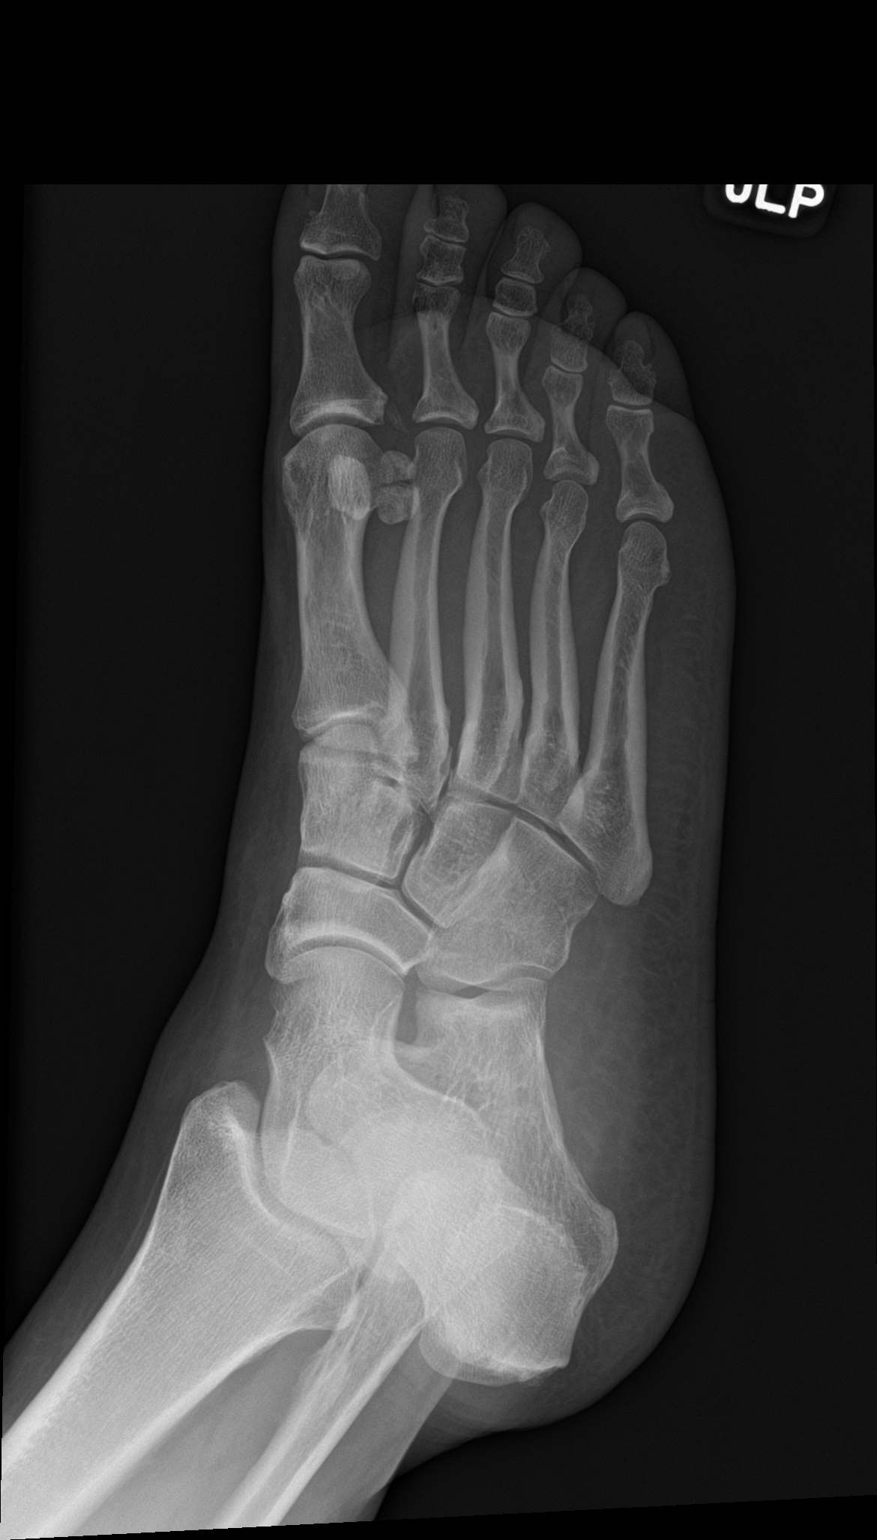

[foot lat]
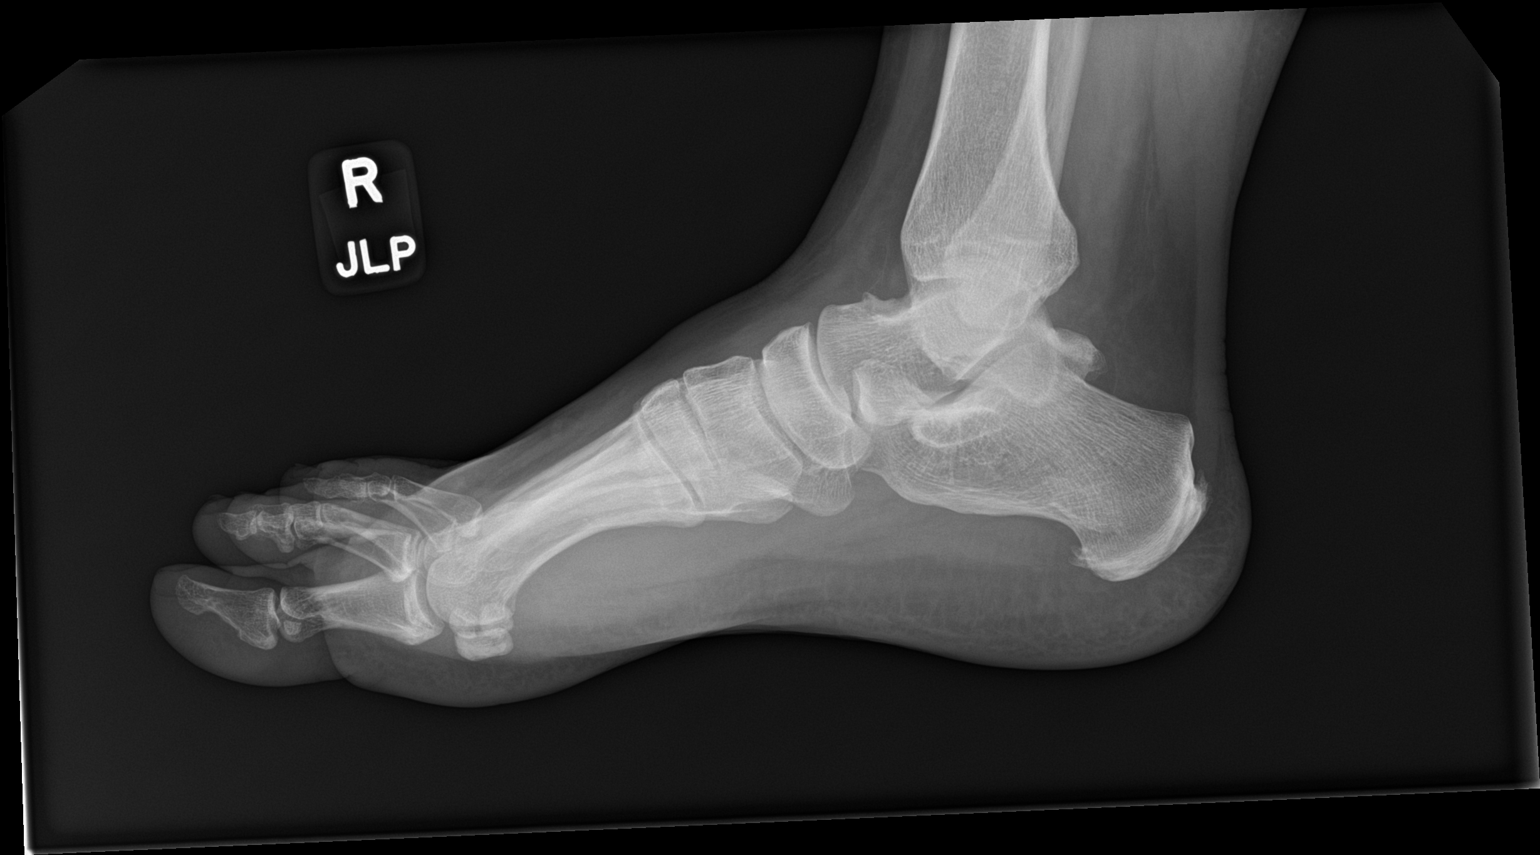

[3 of 3 positions shown; findings below may reference images not displayed]

FINDINGS: Soft tissue swelling, mild suggested over the plantar aspect of the
forefoot. No sign of fracture or dislocation.
IMPRESSION: Soft tissue swelling without fracture or dislocation.

## 2022-04-14 ENCOUNTER — Encounter: Payer: Self-pay | Admitting: Registered Nurse

## 2022-04-14 ENCOUNTER — Ambulatory Visit (INDEPENDENT_AMBULATORY_CARE_PROVIDER_SITE_OTHER): Payer: No Typology Code available for payment source | Admitting: Registered Nurse

## 2022-04-14 VITALS — BP 122/80 | HR 83 | Temp 98.4°F | Resp 20 | Ht 62.0 in | Wt 202.4 lb

## 2022-04-14 DIAGNOSIS — E782 Mixed hyperlipidemia: Secondary | ICD-10-CM

## 2022-04-14 DIAGNOSIS — Z130 Encounter for screening for diseases of the blood and blood-forming organs and certain disorders involving the immune mechanism: Secondary | ICD-10-CM

## 2022-04-14 DIAGNOSIS — Z0000 Encounter for general adult medical examination without abnormal findings: Secondary | ICD-10-CM

## 2022-04-14 DIAGNOSIS — Z13228 Encounter for screening for other metabolic disorders: Secondary | ICD-10-CM

## 2022-04-14 DIAGNOSIS — Z23 Encounter for immunization: Secondary | ICD-10-CM | POA: Diagnosis not present

## 2022-04-14 DIAGNOSIS — Z1329 Encounter for screening for other suspected endocrine disorder: Secondary | ICD-10-CM | POA: Diagnosis not present

## 2022-04-14 MED ORDER — ROSUVASTATIN CALCIUM 5 MG PO TABS: 5.0000 mg | ORAL_TABLET | ORAL | 3 refills | Status: DC
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
## 2022-04-14 NOTE — Patient Instructions (Addendum)
Ms. Julia Garcia to see you!  I recommend: Julia Garcia, [place] Julia Chyle, MD Julia Pap, MD Julia Blazer Early, NP Julia Ruths, DNP Julia Mitts, MD  Thank you for letting me take part in your care,  Julia Garcia

## 2022-04-15 LAB — COMPREHENSIVE METABOLIC PANEL
ALT: 17 U/L (ref 0–35)
AST: 18 U/L (ref 0–37)
Albumin: 4.5 g/dL (ref 3.5–5.2)
Alkaline Phosphatase: 62 U/L (ref 39–117)
BUN: 9 mg/dL (ref 6–23)
CO2: 27 mEq/L (ref 19–32)
Calcium: 9.1 mg/dL (ref 8.4–10.5)
Chloride: 103 mEq/L (ref 96–112)
Creatinine, Ser: 0.71 mg/dL (ref 0.40–1.20)
GFR: 98.52 mL/min (ref >60.00)
Glucose, Bld: 85 mg/dL (ref 70–99)
Potassium: 4.2 mEq/L (ref 3.5–5.1)
Sodium: 137 mEq/L (ref 135–145)
Total Bilirubin: 0.3 mg/dL (ref 0.2–1.2)
Total Protein: 7.3 g/dL (ref 6.0–8.3)

## 2022-04-15 LAB — CBC WITH DIFFERENTIAL/PLATELET
Basophils Absolute: 0.1 10*3/uL (ref 0.0–0.1)
Basophils Relative: 0.6 % (ref 0.0–3.0)
Eosinophils Absolute: 0.1 10*3/uL (ref 0.0–0.7)
Eosinophils Relative: 1.1 % (ref 0.0–5.0)
HCT: 42 % (ref 36.0–46.0)
Hemoglobin: 14 g/dL (ref 12.0–15.0)
Lymphocytes Relative: 29.8 % (ref 12.0–46.0)
Lymphs Abs: 2.6 10*3/uL (ref 0.7–4.0)
MCHC: 33.4 g/dL (ref 30.0–36.0)
MCV: 87.9 fl (ref 78.0–100.0)
Monocytes Absolute: 0.4 10*3/uL (ref 0.1–1.0)
Monocytes Relative: 4.3 % (ref 3.0–12.0)
Neutro Abs: 5.5 10*3/uL (ref 1.4–7.7)
Neutrophils Relative %: 64.2 % (ref 43.0–77.0)
Platelets: 255 10*3/uL (ref 150.0–400.0)
RBC: 4.77 Mil/uL (ref 3.87–5.11)
RDW: 14.7 % (ref 11.5–15.5)
WBC: 8.6 10*3/uL (ref 4.0–10.5)

## 2022-04-15 LAB — LIPID PANEL
Cholesterol: 140 mg/dL (ref 0–200)
HDL: 50.7 mg/dL (ref >39.00)
LDL Cholesterol: 71 mg/dL (ref 0–99)
NonHDL: 89.35
Total CHOL/HDL Ratio: 3
Triglycerides: 92 mg/dL (ref 0.0–149.0)
VLDL: 18.4 mg/dL (ref 0.0–40.0)

## 2022-04-15 LAB — TSH: TSH: 1.41 u[IU]/mL (ref 0.35–5.50)

## 2022-04-15 LAB — HEMOGLOBIN A1C: Hgb A1c MFr Bld: 6 % (ref 4.6–6.5)

## 2023-03-31 ENCOUNTER — Encounter: Payer: No Typology Code available for payment source | Admitting: Family Medicine

## 2023-04-01 NOTE — Progress Notes (Signed)
New Patient Office Visit  Subjective    Patient ID: Julia Garcia, female    DOB: [birth year]  Age: 52 y.o. MRN: [id number]  CC:  Chief Complaint  Patient presents with   Establish Care    Pt is here today to Est Care. Pt is FASTING    HPI Julia Garcia presents to establish care with new provider.   Patients previous primary care provider was Janeece Agee, NP. Last visit was 04/14/2022.  Specialist: Marinell Blight in [place] with Dr. Tania Ade. Last visit: within the last year, 07/02/2022. Dermatology: Duard Larsen, Dermatology with Dr. Okey Regal Mcconnell-yearly exams   Hyperlipidemia: Chronic. Patient is taking Rosuvastatin 5mg  every other day. Denies any muscle cramps.   Patient takes Cyclobenzaprine 10mg  daily occasionally for muscle aches, mostly in back. Tolerates well, effective.   Patients A1c was 6.0 at last labs. She reports she was unaware.   Outpatient Encounter Medications as of 04/02/2023  Medication Sig   [DISCONTINUED] cyclobenzaprine (FLEXERIL) 10 MG tablet Take 1 tablet (10 mg total) by mouth 3 (three) times daily as needed for muscle spasms.   [DISCONTINUED] fluticasone (FLONASE) 50 MCG/ACT nasal spray Place 2 sprays into both nostrils daily.   [DISCONTINUED] rosuvastatin (CRESTOR) 5 MG tablet Take 1 tablet (5 mg total) by mouth every other day.   cyclobenzaprine (FLEXERIL) 10 MG tablet Take 1 tablet (10 mg total) by mouth 3 (three) times daily as needed for muscle spasms.   rosuvastatin (CRESTOR) 5 MG tablet Take 1 tablet (5 mg total) by mouth every other day.   [DISCONTINUED] Azelastine HCl 0.15 % SOLN Place 1 spray into the nose 2 (two) times daily as needed. (Patient not taking: Reported on 04/14/2022)   No facility-administered encounter medications on file as of 04/02/2023.    Past Medical History:  Diagnosis Date   Allergy    Anxiety    Arthritis    Hyperlipidemia    Migraine     Past Surgical History:  Procedure Laterality Date    CESAREAN SECTION  2003    Family History  Problem Relation Age of Onset   COPD Mother    Cancer Mother        Lung and throat   ADD / ADHD Father    Cancer Father        Prostate   ADD / ADHD Sister    Mental illness Sister        Bi-polar   Anxiety disorder Brother    ADD / ADHD Brother    Cancer Maternal Grandmother    Cancer Maternal Grandfather    Anxiety disorder Paternal Grandfather    COPD Paternal Grandfather     Social History   Socioeconomic History   Marital status: Married    Spouse name: Not on file   Number of children: 1   Years of education: Not on file   Highest education level: Bachelor's degree (e.g., BA, AB, BS)  Occupational History   Not on file  Tobacco Use   Smoking status: Every Day    Current packs/day: 0.80    Average packs/day: 0.8 packs/day for 20.0 years (16.0 ttl pk-yrs)    Types: Cigarettes   Smokeless tobacco: Never  Vaping Use   Vaping status: Former   Substances: THC, CBD  Substance and Sexual Activity   Alcohol use: Yes    Comment: 2X DRINKS WEEKLY   Drug use: Yes    Comment: CBD/THC Gummies   Sexual activity: Yes  Partners: Male  Other Topics Concern   Not on file  Social History Narrative   Not on file   Social Determinants of Health   Financial Resource Strain: Not on file  Food Insecurity: Not on file  Transportation Needs: Not on file  Physical Activity: Not on file  Stress: Not on file  Social Connections: Unknown (01/22/2022)   Received [facility]   Social Network    Social Network: Not on file  Intimate Partner Violence: Unknown (12/15/2021)   Received from [facility]   HITS    Physically Hurt: Not on file    Insult or Talk Down To: Not on file    Threaten Physical Harm: Not on file    Scream or Curse: Not on file    ROS See HPI above    Objective    BP 104/70   Pulse 85   Temp 98.5 F (36.9 C)   Ht 5\' 2"  (1.575 m)   Wt 209 lb (94.8 kg)   SpO2 98%    BMI 38.23 kg/m   Physical Exam Vitals reviewed.  Constitutional:      General: She is not in acute distress.    Appearance: Normal appearance. She is obese. She is not ill-appearing, toxic-appearing or diaphoretic.  HENT:     Head: Normocephalic and atraumatic.  Eyes:     General:        Right eye: No discharge.        Left eye: No discharge.     Conjunctiva/sclera: Conjunctivae normal.  Cardiovascular:     Rate and Rhythm: Normal rate and regular rhythm.     Heart sounds: Normal heart sounds. No murmur heard.    No friction rub. No gallop.  Pulmonary:     Effort: Pulmonary effort is normal. No respiratory distress.     Breath sounds: Normal breath sounds.  Musculoskeletal:        General: Normal range of motion.  Skin:    General: Skin is warm and dry.  Neurological:     General: No focal deficit present.     Mental Status: She is alert and oriented to person, place, and time. Mental status is at baseline.  Psychiatric:        Mood and Affect: Mood normal.        Behavior: Behavior normal.        Thought Content: Thought content normal.        Judgment: Judgment normal.      Assessment & Plan:  Mixed hyperlipidemia Assessment & Plan: Continued Rosuvastatin 5mg  every other day. Effective with no muscle aches.  Patient is fasting. Ordered lipid panel.   Orders: -     Lipid panel -     Comprehensive metabolic panel -     Rosuvastatin Calcium; Take 1 tablet (5 mg total) by mouth every other day.  Dispense: 90 tablet; Refill: 3  Prediabetes -     Hemoglobin A1c  Screening for endocrine, metabolic and immunity disorder -     TSH -     CBC with Differential/Platelet  Encounter to establish care  Back pain with right-sided radiculopathy -     Cyclobenzaprine HCl; Take 1 tablet (10 mg total) by mouth 3 (three) times daily as needed for muscle spasms.  Dispense: 30 tablet; Refill: 1  1.Review health maintenance: -Covid vaccine-declined booster 2.Ordered A1c for  prediabetes. Ordered screening TSH and CBC with Diff.  3.Refilled Cyclobenzaprine for back pain  to take as needed.   Return in about 1 month (around 05/03/2023) for physical.   Zandra Abts, NP

## 2023-04-02 ENCOUNTER — Encounter: Payer: Self-pay | Admitting: Family Medicine

## 2023-04-02 ENCOUNTER — Ambulatory Visit (INDEPENDENT_AMBULATORY_CARE_PROVIDER_SITE_OTHER): Payer: 59 | Admitting: Family Medicine

## 2023-04-02 VITALS — BP 104/70 | HR 85 | Temp 98.5°F | Ht 62.0 in | Wt 209.0 lb

## 2023-04-02 DIAGNOSIS — R7303 Prediabetes: Secondary | ICD-10-CM | POA: Diagnosis not present

## 2023-04-02 DIAGNOSIS — M5410 Radiculopathy, site unspecified: Secondary | ICD-10-CM

## 2023-04-02 DIAGNOSIS — Z7689 Persons encountering health services in other specified circumstances: Secondary | ICD-10-CM

## 2023-04-02 DIAGNOSIS — E782 Mixed hyperlipidemia: Secondary | ICD-10-CM

## 2023-04-02 DIAGNOSIS — Z1329 Encounter for screening for other suspected endocrine disorder: Secondary | ICD-10-CM | POA: Diagnosis not present

## 2023-04-02 DIAGNOSIS — Z130 Encounter for screening for diseases of the blood and blood-forming organs and certain disorders involving the immune mechanism: Secondary | ICD-10-CM

## 2023-04-02 MED ORDER — ROSUVASTATIN CALCIUM 5 MG PO TABS: 5.0000 mg | ORAL_TABLET | ORAL | 3 refills | Status: DC

## 2023-04-02 MED ORDER — CYCLOBENZAPRINE HCL 10 MG PO TABS: 10.0000 mg | ORAL_TABLET | Freq: Three times a day (TID) | ORAL | 1 refills | Status: DC | PRN

## 2023-04-02 NOTE — Patient Instructions (Signed)
-  It was a pleasure to meet you and look forward to taking care of you. -Ordered labs. Office will call with results and you may see them on MyChart.  -Refilled Flexeril and Crestor.  -Follow up in 1 month for a physical.

## 2023-04-02 NOTE — Assessment & Plan Note (Signed)
Continued Rosuvastatin 5mg  every other day. Effective with no muscle aches.  Patient is fasting. Ordered lipid panel.

## 2023-04-03 LAB — HEMOGLOBIN A1C
Hgb A1c MFr Bld: 5.8 % of total Hgb — ABNORMAL HIGH (ref <5.7)
Mean Plasma Glucose: 120 mg/dL
eAG (mmol/L): 6.6 mmol/L

## 2023-04-03 LAB — LIPID PANEL
Cholesterol: 172 mg/dL (ref <200)
HDL: 56 mg/dL (ref >=50)
LDL Cholesterol (Calc): 94 mg/dL (calc)
Non-HDL Cholesterol (Calc): 116 mg/dL (calc) (ref <130)
Total CHOL/HDL Ratio: 3.1 (calc) (ref <5.0)
Triglycerides: 122 mg/dL (ref <150)

## 2023-04-03 LAB — COMPREHENSIVE METABOLIC PANEL
AG Ratio: 1.7 (calc) (ref 1.0–2.5)
ALT: 19 U/L (ref 6–29)
AST: 16 U/L (ref 10–35)
Albumin: 4.2 g/dL (ref 3.6–5.1)
Alkaline phosphatase (APISO): 67 U/L (ref 37–153)
BUN: 8 mg/dL (ref 7–25)
CO2: 24 mmol/L (ref 20–32)
Calcium: 9.1 mg/dL (ref 8.6–10.4)
Chloride: 106 mmol/L (ref 98–110)
Creat: 0.68 mg/dL (ref 0.50–1.03)
Globulin: 2.5 g/dL (calc) (ref 1.9–3.7)
Glucose, Bld: 82 mg/dL (ref 65–99)
Potassium: 4.4 mmol/L (ref 3.5–5.3)
Sodium: 139 mmol/L (ref 135–146)
Total Bilirubin: 0.4 mg/dL (ref 0.2–1.2)
Total Protein: 6.7 g/dL (ref 6.1–8.1)

## 2023-04-03 LAB — CBC WITH DIFFERENTIAL/PLATELET
Absolute Monocytes: 489 cells/uL (ref 200–950)
Basophils Absolute: 47 cells/uL (ref 0–200)
Basophils Relative: 0.5 %
Eosinophils Absolute: 85 cells/uL (ref 15–500)
Eosinophils Relative: 0.9 %
HCT: 40.4 % (ref 35.0–45.0)
Hemoglobin: 13.6 g/dL (ref 11.7–15.5)
Lymphs Abs: 2623 cells/uL (ref 850–3900)
MCH: 29.2 pg (ref 27.0–33.0)
MCHC: 33.7 g/dL (ref 32.0–36.0)
MCV: 86.7 fL (ref 80.0–100.0)
MPV: 9.3 fL (ref 7.5–12.5)
Monocytes Relative: 5.2 %
Neutro Abs: 6157 cells/uL (ref 1500–7800)
Neutrophils Relative %: 65.5 %
Platelets: 257 10*3/uL (ref 140–400)
RBC: 4.66 10*6/uL (ref 3.80–5.10)
RDW: 14 % (ref 11.0–15.0)
Total Lymphocyte: 27.9 %
WBC: 9.4 10*3/uL (ref 3.8–10.8)

## 2023-04-03 LAB — TSH: TSH: 1 mIU/L

## 2023-04-05 ENCOUNTER — Telehealth: Payer: Self-pay

## 2023-04-05 NOTE — Telephone Encounter (Signed)
-----   Message from Zandra Abts sent at 04/05/2023  8:14 AM EDT ----- Your A1c has improved since your last labs, barely prediabetes. Recommend to decrease carbohydrates and sweets. If you would like to discuss about medication management for prediabetes, you can make a sooner follow up appointment. All other labs look good!

## 2023-04-14 ENCOUNTER — Encounter (INDEPENDENT_AMBULATORY_CARE_PROVIDER_SITE_OTHER): Payer: Self-pay

## 2023-05-02 NOTE — Progress Notes (Unsigned)
   Complete physical exam  Patient: Julia Garcia   DOB: [birth year]   52 y.o. Female  MRN: [id number]  Subjective:    No chief complaint on file.   Julia Garcia is a 52 y.o. female who presents today for a complete physical exam. She reports consuming a {diet types:17450} diet. {types:19826} She generally feels {DESC; WELL/FAIRLY WELL/POORLY:18703}. She reports sleeping {DESC; WELL/FAIRLY WELL/POORLY:18703}. She {does/does not:200015} have additional problems to discuss today.    Most recent fall risk assessment:    04/02/2023   12:47 PM  Fall Risk   Falls in the past year? 0  Number falls in past yr: 0  Injury with Fall? 0  Risk for fall due to : No Fall Risks  Follow up Falls evaluation completed     Most recent depression screenings:    04/02/2023   12:47 PM 04/14/2022    3:30 PM  PHQ 2/9 Scores  PHQ - 2 Score 3 2  PHQ- 9 Score 15 13    {VISON DENTAL STD PSA (Optional):27386}  {History (Optional):23778}  Patient Care Team: Alveria Apley, NP as PCP - General (Family Medicine) Harden Mo., MD as Referring Physician (Obstetrics and Gynecology) Dr. Mertha Finders MD   Outpatient Medications Prior to Visit  Medication Sig   cyclobenzaprine (FLEXERIL) 10 MG tablet Take 1 tablet (10 mg total) by mouth 3 (three) times daily as needed for muscle spasms.   rosuvastatin (CRESTOR) 5 MG tablet Take 1 tablet (5 mg total) by mouth every other day.   No facility-administered medications prior to visit.    ROS See HPI above       Objective:     There were no vitals taken for this visit. {Vitals History (Optional):23777}  Physical Exam   No results found for any visits on 05/03/23. {Show previous labs (optional):23779}    Assessment & Plan:    Routine Health Maintenance and Physical Exam  Immunization History  Administered Date(s) Administered   Influenza-Unspecified 09/15/2015   Moderna Sars-Covid-2 Vaccination 05/13/2020, 06/21/2020    PFIZER(Purple Top)SARS-COV-2 Vaccination 02/20/2021   Pneumococcal-Unspecified 09/15/2015   Tdap 04/14/2022   Zoster Recombinant(Shingrix) 02/18/2021, 05/05/2021    Health Maintenance  Topic Date Due   COVID-19 Vaccine (4 - 2023-24 season) 05/15/2022   MAMMOGRAM  05/02/2023   INFLUENZA VACCINE  04/15/2023   PAP SMEAR-Modifier  04/10/2024   Colonoscopy  04/13/2031   DTaP/Tdap/Td (2 - Td or Tdap) 04/14/2032   Hepatitis C Screening  Completed   HIV Screening  Completed   Zoster Vaccines- Shingrix  Completed   HPV VACCINES  Aged Out    Discussed health benefits of physical activity, and encouraged her to engage in regular exercise appropriate for her age and condition.  There are no diagnoses linked to this encounter.  No follow-ups on file.     Zandra Abts, NP

## 2023-05-03 ENCOUNTER — Ambulatory Visit (INDEPENDENT_AMBULATORY_CARE_PROVIDER_SITE_OTHER): Payer: 59 | Admitting: Family Medicine

## 2023-05-03 ENCOUNTER — Encounter: Payer: Self-pay | Admitting: Family Medicine

## 2023-05-03 VITALS — BP 102/68 | HR 91 | Temp 97.9°F | Ht 61.0 in | Wt 205.5 lb

## 2023-05-03 DIAGNOSIS — Z0000 Encounter for general adult medical examination without abnormal findings: Secondary | ICD-10-CM

## 2023-05-03 NOTE — Patient Instructions (Addendum)
-  Physical exam completed with no concerns.  -Continue medications.  -Continue with a healthy diet and regular exercise. -Follow up in 6 months. Please be fasting.

## 2023-09-06 LAB — HM MAMMOGRAPHY

## 2023-12-01 ENCOUNTER — Ambulatory Visit: Payer: 59 | Admitting: Family Medicine

## 2023-12-01 VITALS — BP 120/74 | HR 85 | Temp 98.5°F | Ht 61.0 in | Wt 210.0 lb

## 2023-12-01 DIAGNOSIS — E782 Mixed hyperlipidemia: Secondary | ICD-10-CM | POA: Diagnosis not present

## 2023-12-01 DIAGNOSIS — M5410 Radiculopathy, site unspecified: Secondary | ICD-10-CM | POA: Diagnosis not present

## 2023-12-01 DIAGNOSIS — L0292 Furuncle, unspecified: Secondary | ICD-10-CM

## 2023-12-01 LAB — COMPREHENSIVE METABOLIC PANEL
ALT: 15 U/L (ref 0–35)
AST: 18 U/L (ref 0–37)
Albumin: 4.6 g/dL (ref 3.5–5.2)
Alkaline Phosphatase: 62 U/L (ref 39–117)
BUN: 11 mg/dL (ref 6–23)
CO2: 29 meq/L (ref 19–32)
Calcium: 9.3 mg/dL (ref 8.4–10.5)
Chloride: 106 meq/L (ref 96–112)
Creatinine, Ser: 0.57 mg/dL (ref 0.40–1.20)
GFR: 104.1 mL/min (ref >60.00)
Glucose, Bld: 94 mg/dL (ref 70–99)
Potassium: 4.7 meq/L (ref 3.5–5.1)
Sodium: 142 meq/L (ref 135–145)
Total Bilirubin: 0.4 mg/dL (ref 0.2–1.2)
Total Protein: 7.4 g/dL (ref 6.0–8.3)

## 2023-12-01 LAB — LIPID PANEL
Cholesterol: 146 mg/dL (ref 0–200)
HDL: 53.9 mg/dL (ref >39.00)
LDL Cholesterol: 69 mg/dL (ref 0–99)
NonHDL: 91.7
Total CHOL/HDL Ratio: 3
Triglycerides: 114 mg/dL (ref 0.0–149.0)
VLDL: 22.8 mg/dL (ref 0.0–40.0)

## 2023-12-01 MED ORDER — CEPHALEXIN 500 MG PO CAPS: 500.0000 mg | ORAL_CAPSULE | Freq: Three times a day (TID) | ORAL | 0 refills | Status: AC

## 2023-12-01 MED ORDER — CYCLOBENZAPRINE HCL 10 MG PO TABS: 10.0000 mg | ORAL_TABLET | Freq: Three times a day (TID) | ORAL | 1 refills | Status: DC | PRN

## 2023-12-01 NOTE — Assessment & Plan Note (Signed)
 Continued Rosuvastatin 5mg  every other day. Effective with side effects.  Patient is fasting. Ordered lipid panel and CMP to assess liver function.

## 2023-12-01 NOTE — Patient Instructions (Addendum)
-  It was good to see you today.  -Refilled Flexeril.  -Continue Rosuvastatin every other day. -Ordered labs. Office will call with lab results and you will see them on MyChart. -Prescribed Keflex 500mg  tablet, take 1 tablet every 8 hours for 7 days. Medication is close to Amoxicillin, which you have had diarrhea before. Recommend to take with food. If it causes severe diarrhea, call the office or send a MyChart. Will switch antibiotic.  -Continue to cleanse the boils as you have been doing. If it is starts to drain, cover the area. Follow up if not improved.

## 2023-12-01 NOTE — Progress Notes (Signed)
 Established Patient Office Visit   Subjective:  Patient ID: Julia Garcia, female    DOB: 01/30/1971  Age: 53 y.o. MRN: [id number]  Chief Complaint  Patient presents with   Medical Management of Chronic Issues    HPI Hyperlipidemia: Chronic. Patient is taking Rosuvastatin 5mg  every other day. Denies any muscle cramps, abd pain, nausea, and vomiting.    Patient takes Cyclobenzaprine 10mg  daily occasionally for muscle aches, mostly in back. Tolerates well, effective.   Patient is complaining of boils on her right groin. Patient has had to make multiple trips to [place] and noticed them since doing this traveling. She reports she has been washing the area twice a day.  ROS See HPI above     Objective:   BP 120/74   Pulse 85   Temp 98.5 F (36.9 C) (Oral)   Ht 5\' 1"  (1.549 m)   Wt 210 lb (95.3 kg)   SpO2 96%   BMI 39.68 kg/m    Physical Exam Vitals reviewed.  Constitutional:      General: She is not in acute distress.    Appearance: Normal appearance. She is obese. She is not ill-appearing, toxic-appearing or diaphoretic.  HENT:     Head: Normocephalic and atraumatic.  Eyes:     General:        Right eye: No discharge.        Left eye: No discharge.     Conjunctiva/sclera: Conjunctivae normal.  Cardiovascular:     Rate and Rhythm: Normal rate and regular rhythm.     Heart sounds: Normal heart sounds. No murmur heard.    No friction rub. No gallop.  Pulmonary:     Effort: Pulmonary effort is normal. No respiratory distress.     Breath sounds: Normal breath sounds.  Musculoskeletal:        General: Normal range of motion.  Skin:    General: Skin is warm and dry.     Comments: Two boils on right groin. No open area. No redness at the skin. Gildardo Pounds, CMA chaperoned.   Neurological:     General: No focal deficit present.     Mental Status: She is alert and oriented to person, place, and time. Mental status is at baseline.  Psychiatric:        Mood and Affect: Mood  normal.        Behavior: Behavior normal.        Thought Content: Thought content normal.        Judgment: Judgment normal.      Assessment & Plan:  Mixed hyperlipidemia Assessment & Plan: Continued Rosuvastatin 5mg  every other day. Effective with side effects.  Patient is fasting. Ordered lipid panel and CMP to assess liver function.     Orders: -     Lipid panel -     Comprehensive metabolic panel  Back pain with right-sided radiculopathy -     Cyclobenzaprine HCl; Take 1 tablet (10 mg total) by mouth 3 (three) times daily as needed for muscle spasms.  Dispense: 30 tablet; Refill: 1  Boils of multiple sites -     Cephalexin; Take 1 capsule (500 mg total) by mouth 3 (three) times daily for 7 days.  Dispense: 21 capsule; Refill: 0   1.Review health maintenance: -Cervical Cancer Screening: 2023, Request records with [facility]  -Covid booster: Declines -PNA vaccine: Declines  -Influenza vaccine: Declines  2.Patients A1c has dropped down to 5.5, seen results on her phone from when she  went to the [employer] 2 weeks ago.  3.Refilled Flexeril for back pain with right sided radiculopathy.  4.Prescribed Keflex 500mg  tablet, TID for 7 days due to boils. Discussed about medication with allergies to other antibiotics. Patient does not recall that Keflex has bothered her previously. Advised to continue to cleanse the boils as she have been doing. If it is starts to drain, cover the area. Follow up if not improved.  Return in about 6 months (around 06/02/2024) for physical.   Zandra Abts, NP

## 2023-12-02 ENCOUNTER — Encounter: Payer: Self-pay | Admitting: Family Medicine

## 2024-02-18 ENCOUNTER — Other Ambulatory Visit: Payer: Self-pay

## 2024-02-18 DIAGNOSIS — E782 Mixed hyperlipidemia: Secondary | ICD-10-CM

## 2024-02-18 MED ORDER — ROSUVASTATIN CALCIUM 5 MG PO TABS: 5.0000 mg | ORAL_TABLET | ORAL | 3 refills | Status: DC

## 2024-05-30 ENCOUNTER — Ambulatory Visit: Payer: Self-pay | Admitting: Family Medicine

## 2024-05-30 ENCOUNTER — Encounter: Payer: Self-pay | Admitting: Family Medicine

## 2024-05-30 ENCOUNTER — Ambulatory Visit: Admitting: Family Medicine

## 2024-05-30 VITALS — BP 110/72 | HR 77 | Temp 97.6°F | Ht 61.0 in | Wt 204.0 lb

## 2024-05-30 DIAGNOSIS — Z0000 Encounter for general adult medical examination without abnormal findings: Secondary | ICD-10-CM

## 2024-05-30 DIAGNOSIS — Z6838 Body mass index (BMI) 38.0-38.9, adult: Secondary | ICD-10-CM | POA: Diagnosis not present

## 2024-05-30 DIAGNOSIS — E782 Mixed hyperlipidemia: Secondary | ICD-10-CM | POA: Diagnosis not present

## 2024-05-30 DIAGNOSIS — E559 Vitamin D deficiency, unspecified: Secondary | ICD-10-CM

## 2024-05-30 DIAGNOSIS — E66812 Obesity, class 2: Secondary | ICD-10-CM | POA: Diagnosis not present

## 2024-05-30 DIAGNOSIS — E6609 Other obesity due to excess calories: Secondary | ICD-10-CM | POA: Diagnosis not present

## 2024-05-30 DIAGNOSIS — M5410 Radiculopathy, site unspecified: Secondary | ICD-10-CM

## 2024-05-30 DIAGNOSIS — R7303 Prediabetes: Secondary | ICD-10-CM

## 2024-05-30 LAB — CBC WITH DIFFERENTIAL/PLATELET
Basophils Absolute: 0 K/uL (ref 0.0–0.1)
Basophils Relative: 0.4 % (ref 0.0–3.0)
Eosinophils Absolute: 0.1 K/uL (ref 0.0–0.7)
Eosinophils Relative: 1.2 % (ref 0.0–5.0)
HCT: 41 % (ref 36.0–46.0)
Hemoglobin: 13.6 g/dL (ref 12.0–15.0)
Lymphocytes Relative: 23.1 % (ref 12.0–46.0)
Lymphs Abs: 1.8 K/uL (ref 0.7–4.0)
MCHC: 33.2 g/dL (ref 30.0–36.0)
MCV: 87.2 fl (ref 78.0–100.0)
Monocytes Absolute: 0.5 K/uL (ref 0.1–1.0)
Monocytes Relative: 6.7 % (ref 3.0–12.0)
Neutro Abs: 5.4 K/uL (ref 1.4–7.7)
Neutrophils Relative %: 68.6 % (ref 43.0–77.0)
Platelets: 223 K/uL (ref 150.0–400.0)
RBC: 4.71 Mil/uL (ref 3.87–5.11)
RDW: 14.6 % (ref 11.5–15.5)
WBC: 7.8 K/uL (ref 4.0–10.5)

## 2024-05-30 LAB — LIPID PANEL
Cholesterol: 161 mg/dL (ref 0–200)
HDL: 47 mg/dL (ref >39.00)
LDL Cholesterol: 86 mg/dL (ref 0–99)
NonHDL: 114.34
Total CHOL/HDL Ratio: 3
Triglycerides: 140 mg/dL (ref 0.0–149.0)
VLDL: 28 mg/dL (ref 0.0–40.0)

## 2024-05-30 LAB — HEMOGLOBIN A1C: Hgb A1c MFr Bld: 6.3 % (ref 4.6–6.5)

## 2024-05-30 LAB — COMPREHENSIVE METABOLIC PANEL WITH GFR
ALT: 18 U/L (ref 0–35)
AST: 18 U/L (ref 0–37)
Albumin: 4.5 g/dL (ref 3.5–5.2)
Alkaline Phosphatase: 62 U/L (ref 39–117)
BUN: 12 mg/dL (ref 6–23)
CO2: 28 meq/L (ref 19–32)
Calcium: 9.5 mg/dL (ref 8.4–10.5)
Chloride: 103 meq/L (ref 96–112)
Creatinine, Ser: 0.63 mg/dL (ref 0.40–1.20)
GFR: 101.26 mL/min (ref >60.00)
Glucose, Bld: 90 mg/dL (ref 70–99)
Potassium: 4.4 meq/L (ref 3.5–5.1)
Sodium: 141 meq/L (ref 135–145)
Total Bilirubin: 0.4 mg/dL (ref 0.2–1.2)
Total Protein: 7.2 g/dL (ref 6.0–8.3)

## 2024-05-30 LAB — TSH: TSH: 1.98 u[IU]/mL (ref 0.35–5.50)

## 2024-05-30 LAB — VITAMIN D 25 HYDROXY (VIT D DEFICIENCY, FRACTURES): VITD: 13.93 ng/mL — ABNORMAL LOW (ref 30.00–100.00)

## 2024-05-30 MED ORDER — CYCLOBENZAPRINE HCL 10 MG PO TABS: 10.0000 mg | ORAL_TABLET | Freq: Three times a day (TID) | ORAL | 1 refills | Status: AC | PRN

## 2024-05-30 MED ORDER — ROSUVASTATIN CALCIUM 5 MG PO TABS
5.0000 mg | ORAL_TABLET | ORAL | 3 refills | Status: AC
Start: 2024-05-30 — End: ?

## 2024-05-30 MED ORDER — VITAMIN D3 1.25 MG (50000 UT) PO CAPS: 1.2500 mg | ORAL_CAPSULE | ORAL | 0 refills | Status: AC

## 2024-05-30 NOTE — Patient Instructions (Addendum)
-  It was great to see you today.  -Physical exam completed today.  -Ordered labs (CBC, CMP, A1c, Lipid panel-fasting, TSH, and Vitamin D ) based on BMI-obesity, prediabetes, hyperlipidemia, and vitamin D  deficiency.  -Refilled Rosuvastatin  for hyperlipidemia and Cyclobenzaprine  for back pain.  -Work on a healthy diet and continue participating in regular exercise. -Follow up in 6 months.

## 2024-05-30 NOTE — Progress Notes (Signed)
 Complete physical exam  Patient: Julia Garcia   DOB: Oct 26, 1970   53 y.o. Female  MRN: [id number]  Subjective:    Chief Complaint  Patient presents with   Annual Exam    Julia Garcia is a 53 y.o. female who presents today for a complete physical exam. She reports consuming a general diet. Exercise: Walking 10-15 minutes about a mile a day. She generally feels well. She reports sleeping poorly. She does not have additional problems to discuss today.    Most recent fall risk assessment:    05/30/2024    7:27 AM  Fall Risk   Falls in the past year? 0  Number falls in past yr: 0  Injury with Fall? 0  Risk for fall due to : No Fall Risks  Follow up Falls evaluation completed     Most recent depression screenings:    05/30/2024    7:27 AM 12/01/2023    8:39 AM  PHQ 2/9 Scores  PHQ - 2 Score 0 1  PHQ- 9 Score 5     Vision:Within last year and Dental: No current dental problems and Receives regular dental care  Past Medical History:  Diagnosis Date   Allergy    Anxiety    Arthritis    Hyperlipidemia    Migraine    Past Surgical History:  Procedure Laterality Date   CESAREAN SECTION  2003   Social History   Tobacco Use   Smoking status: Every Day    Current packs/day: 0.80    Average packs/day: 0.8 packs/day for 20.0 years (16.0 ttl pk-yrs)    Types: Cigarettes   Smokeless tobacco: Never  Vaping Use   Vaping status: Former   Substances: THC, CBD  Substance Use Topics   Alcohol use: Yes    Comment: 2X DRINKS WEEKLY   Drug use: Yes    Comment: CBD/THC Gummies   Social History   Socioeconomic History   Marital status: Married    Spouse name: Not on file   Number of children: 1   Years of education: Not on file   Highest education level: Bachelor's degree (e.g., BA, AB, BS)  Occupational History   Not on file  Tobacco Use   Smoking status: Every Day    Current packs/day: 0.80    Average packs/day: 0.8 packs/day for 20.0 years (16.0 ttl pk-yrs)     Types: Cigarettes   Smokeless tobacco: Never  Vaping Use   Vaping status: Former   Substances: THC, CBD  Substance and Sexual Activity   Alcohol use: Yes    Comment: 2X DRINKS WEEKLY   Drug use: Yes    Comment: CBD/THC Gummies   Sexual activity: Yes    Partners: Male  Other Topics Concern   Not on file  Social History Narrative   Not on file   Social Drivers of Health   Financial Resource Strain: Medium Risk (12/01/2023)   Overall Financial Resource Strain (CARDIA)    Difficulty of Paying Living Expenses: Somewhat hard  Food Insecurity: No Food Insecurity (12/01/2023)   Hunger Vital Sign    Worried About Running Out of Food in the Last Year: Never true    Ran Out of Food in the Last Year: Never true  Transportation Needs: No Transportation Needs (12/01/2023)   PRAPARE - Administrator, Civil Service (Medical): No    Lack of Transportation (Non-Medical): No  Physical Activity: Insufficiently Active (12/01/2023)   Exercise Vital Sign  Days of Exercise per Week: 2 days    Minutes of Exercise per Session: 10 min  Stress: Stress Concern Present (12/01/2023)   [employer] of Occupational Health - Occupational Stress Questionnaire    Feeling of Stress : To some extent  Social Connections: Socially Integrated (12/01/2023)   Social Connection and Isolation Panel    Frequency of Communication with Friends and Family: More than three times a week    Frequency of Social Gatherings with Friends and Family: Once a week    Attends Religious Services: More than 4 times per year    Active Member of [employer] or Organizations: Yes    Attends Banker Meetings: More than 4 times per year    Marital Status: Married  Catering manager Violence: Not At Risk (05/03/2023)   Humiliation, Afraid, Rape, and Kick questionnaire    Fear of Current or Ex-Partner: No    Emotionally Abused: No    Physically Abused: No    Sexually Abused: No   Family Status  Relation Name  Status   Mother  Alive   Father  Alive   Sister  Alive   Brother  Alive   Son  Alive   MGM  Deceased   MGF  Deceased   PGM  Deceased   PGF  Deceased  No partnership data on file   Family History  Problem Relation Age of Onset   COPD Mother    Cancer Mother        Lung and throat   Other Father        Hypoglycemia episodes   ADD / ADHD Father    Cancer Father        Prostate   Other Sister        [place] Disease   ADD / ADHD Sister    Mental illness Sister        Bi-polar   Anxiety disorder Brother    ADD / ADHD Brother    Cancer Maternal Grandmother    Cancer Maternal Grandfather    Anxiety disorder Paternal Grandfather    COPD Paternal Grandfather    Allergies  Allergen Reactions   Latex Swelling   Other    Zithromax [Azithromycin] Swelling    Gets blisters filled with fluid   Amoxicillin  Diarrhea    Pt states it gave her really bad diarrhea even after taking it after meals    Patient Care Team: Billy Philippe SAUNDERS, NP as PCP - General (Family Medicine) Elin Marjorie BRAVO., MD as Referring Physician (Obstetrics and Gynecology) Dr. Norleen HILARIO Shona Mickey MD   Outpatient Medications Prior to Visit  Medication Sig   Ferrous Sulfate (IRON) 325 (65 Fe) MG TABS Take 1 tablet by mouth daily.   vitamin C (ASCORBIC ACID) 250 MG tablet Take 250 mg by mouth daily.   [DISCONTINUED] cyclobenzaprine  (FLEXERIL ) 10 MG tablet Take 1 tablet (10 mg total) by mouth 3 (three) times daily as needed for muscle spasms.   [DISCONTINUED] rosuvastatin  (CRESTOR ) 5 MG tablet Take 1 tablet (5 mg total) by mouth every other day.   No facility-administered medications prior to visit.    Review of Systems  Constitutional: Negative.   HENT: Negative.    Eyes: Negative.   Respiratory: Negative.    Cardiovascular: Negative.   Gastrointestinal: Negative.   Genitourinary: Negative.   Musculoskeletal: Negative.   Skin: Negative.   Neurological:        Hot flashes   Endo/Heme/Allergies:  Negative.   Psychiatric/Behavioral:  The patient is nervous/anxious and has insomnia.    See HPI above    Objective:   BP 110/72   Pulse 77   Temp 97.6 F (36.4 C) (Oral)   Ht 5' 1 (1.549 m)   Wt 204 lb (92.5 kg)   SpO2 95%   BMI 38.55 kg/m    Physical Exam Vitals reviewed.  Constitutional:      General: She is not in acute distress.    Appearance: Normal appearance. She is obese. She is not ill-appearing, toxic-appearing or diaphoretic.  HENT:     Head: Normocephalic and atraumatic.     Right Ear: Tympanic membrane, ear canal and external ear normal. There is no impacted cerumen.     Left Ear: Tympanic membrane, ear canal and external ear normal. There is no impacted cerumen.     Nose:     Right Sinus: No maxillary sinus tenderness or frontal sinus tenderness.     Left Sinus: No maxillary sinus tenderness or frontal sinus tenderness.     Mouth/Throat:     Mouth: Mucous membranes are moist.     Pharynx: Oropharynx is clear. Uvula midline. No pharyngeal swelling, oropharyngeal exudate, posterior oropharyngeal erythema, uvula swelling or postnasal drip.  Eyes:     General:        Right eye: No discharge.        Left eye: No discharge.     Conjunctiva/sclera: Conjunctivae normal.     Pupils: Pupils are equal, round, and reactive to light.  Neck:     Thyroid: No thyromegaly.  Cardiovascular:     Rate and Rhythm: Normal rate and regular rhythm.     Pulses:          Posterior tibial pulses are 2+ on the right side and 2+ on the left side.     Heart sounds: Normal heart sounds. No murmur heard.    No friction rub. No gallop.  Pulmonary:     Effort: Pulmonary effort is normal. No respiratory distress.     Breath sounds: Normal breath sounds.  Abdominal:     General: Abdomen is flat. Bowel sounds are normal. There is no distension.     Palpations: Abdomen is soft. There is no mass.     Tenderness: There is no abdominal tenderness.  Musculoskeletal:        General: Normal  range of motion.     Cervical back: Normal range of motion.     Right lower leg: No edema.     Left lower leg: No edema.  Lymphadenopathy:     Cervical: No cervical adenopathy.  Skin:    General: Skin is warm and dry.  Neurological:     General: No focal deficit present.     Mental Status: She is alert and oriented to person, place, and time. Mental status is at baseline.     Motor: No weakness.     Gait: Gait normal.  Psychiatric:        Mood and Affect: Mood normal.        Behavior: Behavior normal.        Thought Content: Thought content normal.        Judgment: Judgment normal.        Assessment & Plan:    Routine Health Maintenance and Physical Exam  Immunization History  Administered Date(s) Administered   Influenza-Unspecified 09/15/2015   Moderna Sars-Covid-2 Vaccination 05/13/2020, 06/21/2020   PFIZER(Purple Top)SARS-COV-2 Vaccination 02/20/2021   Pneumococcal-Unspecified 09/15/2015  Tdap 04/14/2022   Zoster Recombinant(Shingrix) 02/18/2021, 05/05/2021    Health Maintenance  Topic Date Due   Pneumococcal Vaccine: 50+ Years (1 of 2 - PCV) 12/19/1989   Hepatitis B Vaccines 19-59 Average Risk (1 of 3 - 19+ 3-dose series) Never done   Cervical Cancer Screening (HPV/Pap Cotest)  Never done   Influenza Vaccine  04/14/2024   COVID-19 Vaccine (4 - 2025-26 season) 05/15/2024   Mammogram  09/05/2025   Colonoscopy  04/13/2031   DTaP/Tdap/Td (2 - Td or Tdap) 04/14/2032   Hepatitis C Screening  Completed   HIV Screening  Completed   Zoster Vaccines- Shingrix  Completed   HPV VACCINES  Aged Out   Meningococcal B Vaccine  Aged Out    Discussed health benefits of physical activity, and encouraged her to engage in regular exercise appropriate for her age and condition.  Annual physical exam  Class 2 obesity due to excess calories without serious comorbidity with body mass index (BMI) of 38.0 to 38.9 in adult -     CBC with Differential/Platelet -     Comprehensive  metabolic panel with GFR -     Hemoglobin A1c -     Lipid panel -     TSH  Vitamin D  deficiency -     VITAMIN D  25 Hydroxy (Vit-D Deficiency, Fractures)  Mixed hyperlipidemia -     Comprehensive metabolic panel with GFR -     Lipid panel -     Rosuvastatin  Calcium ; Take 1 tablet (5 mg total) by mouth every other day.  Dispense: 90 tablet; Refill: 3  Back pain with right-sided radiculopathy -     Cyclobenzaprine  HCl; Take 1 tablet (10 mg total) by mouth 3 (three) times daily as needed for muscle spasms.  Dispense: 30 tablet; Refill: 1  Prediabetes -     Hemoglobin A1c  1.Review health maintenance:  -Cervical cancer screening: Last year, September 2024-[place]  -Covid vaccine: Does not want prescription  -Hep B vaccine: Not had  -Influenza vaccine: Declines  -PNA vaccine: Not had since 2017 2.Physical exam completed today.  3.Ordered labs (CBC, CMP, A1c, Lipid panel-fasting, TSH, and Vitamin D ) based on BMI-obesity, prediabetes, hyperlipidemia, and vitamin D  deficiency.  4.Refilled Rosuvastatin  for hyperlipidemia and Cyclobenzaprine  for back pain.  5.Work on a healthy diet and continue participating in regular exercise.  Return in about 6 months (around 11/27/2024) for chronic management.    Ardeth Repetto, NP

## 2024-08-15 ENCOUNTER — Other Ambulatory Visit: Payer: Self-pay | Admitting: Family Medicine

## 2024-08-15 DIAGNOSIS — E559 Vitamin D deficiency, unspecified: Secondary | ICD-10-CM

## 2024-09-11 LAB — HM MAMMOGRAPHY

## 2024-11-27 ENCOUNTER — Ambulatory Visit: Admitting: Family Medicine
